# Patient Record
Sex: Female | Born: 1955 | Race: White | Hispanic: No | Marital: Married | Smoking: Former smoker
Health system: Southern US, Community
[De-identification: ages and names within clinical notes are randomized; demographics above are authoritative.]

## PROBLEM LIST (undated history)

## (undated) DIAGNOSIS — E7800 Pure hypercholesterolemia, unspecified: Secondary | ICD-10-CM

## (undated) DIAGNOSIS — I10 Essential (primary) hypertension: Secondary | ICD-10-CM

## (undated) DIAGNOSIS — G4730 Sleep apnea, unspecified: Secondary | ICD-10-CM

## (undated) DIAGNOSIS — F1010 Alcohol abuse, uncomplicated: Secondary | ICD-10-CM

## (undated) HISTORY — DX: Sleep apnea, unspecified: G47.30

## (undated) HISTORY — PX: KNEE ARTHROSCOPY: SUR90

## (undated) HISTORY — DX: Alcohol abuse, uncomplicated: F10.10

---

## 1997-07-14 ENCOUNTER — Other Ambulatory Visit: Admission: RE | Admit: 1997-07-14 | Discharge: 1997-07-14 | Payer: Self-pay | Admitting: *Deleted

## 1997-11-29 ENCOUNTER — Ambulatory Visit (HOSPITAL_COMMUNITY): Admission: RE | Admit: 1997-11-29 | Discharge: 1997-11-29 | Payer: Self-pay | Admitting: Internal Medicine

## 1997-12-07 ENCOUNTER — Encounter: Payer: Self-pay | Admitting: Internal Medicine

## 1997-12-07 ENCOUNTER — Ambulatory Visit (HOSPITAL_COMMUNITY): Admission: RE | Admit: 1997-12-07 | Discharge: 1997-12-07 | Payer: Self-pay | Admitting: Internal Medicine

## 1998-06-15 ENCOUNTER — Encounter: Payer: Self-pay | Admitting: *Deleted

## 1998-06-15 ENCOUNTER — Ambulatory Visit (HOSPITAL_COMMUNITY): Admission: RE | Admit: 1998-06-15 | Discharge: 1998-06-15 | Payer: Self-pay | Admitting: *Deleted

## 1999-11-07 ENCOUNTER — Encounter: Payer: Self-pay | Admitting: *Deleted

## 1999-11-07 ENCOUNTER — Ambulatory Visit (HOSPITAL_COMMUNITY): Admission: RE | Admit: 1999-11-07 | Discharge: 1999-11-07 | Payer: Self-pay | Admitting: *Deleted

## 1999-11-15 ENCOUNTER — Other Ambulatory Visit: Admission: RE | Admit: 1999-11-15 | Discharge: 1999-11-15 | Payer: Self-pay | Admitting: *Deleted

## 2000-11-25 ENCOUNTER — Encounter: Payer: Self-pay | Admitting: *Deleted

## 2000-11-25 ENCOUNTER — Ambulatory Visit (HOSPITAL_COMMUNITY): Admission: RE | Admit: 2000-11-25 | Discharge: 2000-11-25 | Payer: Self-pay | Admitting: *Deleted

## 2001-11-12 ENCOUNTER — Other Ambulatory Visit: Admission: RE | Admit: 2001-11-12 | Discharge: 2001-11-12 | Payer: Self-pay | Admitting: *Deleted

## 2002-12-31 ENCOUNTER — Ambulatory Visit (HOSPITAL_COMMUNITY): Admission: RE | Admit: 2002-12-31 | Discharge: 2002-12-31 | Payer: Self-pay | Admitting: *Deleted

## 2003-03-17 ENCOUNTER — Other Ambulatory Visit: Admission: RE | Admit: 2003-03-17 | Discharge: 2003-03-17 | Payer: Self-pay | Admitting: *Deleted

## 2003-08-02 ENCOUNTER — Ambulatory Visit (HOSPITAL_COMMUNITY): Admission: RE | Admit: 2003-08-02 | Discharge: 2003-08-02 | Payer: Self-pay | Admitting: Orthopedic Surgery

## 2003-08-02 ENCOUNTER — Ambulatory Visit (HOSPITAL_BASED_OUTPATIENT_CLINIC_OR_DEPARTMENT_OTHER): Admission: RE | Admit: 2003-08-02 | Discharge: 2003-08-02 | Payer: Self-pay | Admitting: Orthopedic Surgery

## 2004-02-23 ENCOUNTER — Ambulatory Visit: Payer: Self-pay | Admitting: Internal Medicine

## 2004-04-10 ENCOUNTER — Other Ambulatory Visit: Admission: RE | Admit: 2004-04-10 | Discharge: 2004-04-10 | Payer: Self-pay | Admitting: *Deleted

## 2004-05-02 ENCOUNTER — Ambulatory Visit: Payer: Self-pay | Admitting: Internal Medicine

## 2004-07-25 ENCOUNTER — Ambulatory Visit: Payer: Self-pay | Admitting: Internal Medicine

## 2004-08-08 ENCOUNTER — Ambulatory Visit: Payer: Self-pay | Admitting: Internal Medicine

## 2004-10-31 ENCOUNTER — Encounter: Admission: RE | Admit: 2004-10-31 | Discharge: 2004-10-31 | Payer: Self-pay | Admitting: Internal Medicine

## 2004-11-01 ENCOUNTER — Ambulatory Visit: Payer: Self-pay | Admitting: Internal Medicine

## 2005-08-04 ENCOUNTER — Ambulatory Visit: Payer: Self-pay | Admitting: Internal Medicine

## 2005-08-07 ENCOUNTER — Ambulatory Visit: Payer: Self-pay | Admitting: Internal Medicine

## 2005-08-10 ENCOUNTER — Ambulatory Visit (HOSPITAL_COMMUNITY): Admission: RE | Admit: 2005-08-10 | Discharge: 2005-08-10 | Payer: Self-pay | Admitting: Internal Medicine

## 2005-08-22 ENCOUNTER — Ambulatory Visit: Payer: Self-pay | Admitting: Internal Medicine

## 2005-09-11 ENCOUNTER — Ambulatory Visit: Payer: Self-pay | Admitting: Internal Medicine

## 2005-10-12 ENCOUNTER — Ambulatory Visit: Payer: Self-pay | Admitting: Internal Medicine

## 2006-01-23 ENCOUNTER — Encounter: Admission: RE | Admit: 2006-01-23 | Discharge: 2006-01-23 | Payer: Self-pay | Admitting: Obstetrics and Gynecology

## 2006-03-05 ENCOUNTER — Ambulatory Visit: Payer: Self-pay | Admitting: Internal Medicine

## 2006-03-05 LAB — CONVERTED CEMR LAB
ALT: 18 units/L (ref 0–40)
AST: 16 units/L (ref 0–37)
Albumin: 4 g/dL (ref 3.5–5.2)
Alkaline Phosphatase: 57 units/L (ref 39–117)
BUN: 22 mg/dL (ref 6–23)
Basophils Absolute: 0 10*3/uL (ref 0.0–0.1)
Basophils Relative: 0.6 % (ref 0.0–1.0)
CO2: 29 meq/L (ref 19–32)
Calcium: 9.3 mg/dL (ref 8.4–10.5)
Chloride: 104 meq/L (ref 96–112)
Cholesterol: 225 mg/dL (ref 0–200)
Creatinine, Ser: 0.8 mg/dL (ref 0.4–1.2)
Direct LDL: 141.1 mg/dL
Eosinophils Relative: 3.9 % (ref 0.0–5.0)
GFR calc Af Amer: 98 mL/min
GFR calc non Af Amer: 81 mL/min
Glucose, Bld: 94 mg/dL (ref 70–99)
HCT: 32.4 % — ABNORMAL LOW (ref 36.0–46.0)
HDL: 51.4 mg/dL (ref >39.0)
Hemoglobin: 11.3 g/dL — ABNORMAL LOW (ref 12.0–15.0)
Lymphocytes Relative: 43.2 % (ref 12.0–46.0)
MCHC: 34.7 g/dL (ref 30.0–36.0)
MCV: 87.8 fL (ref 78.0–100.0)
Monocytes Absolute: 0.4 10*3/uL (ref 0.2–0.7)
Monocytes Relative: 7.5 % (ref 3.0–11.0)
Neutro Abs: 2.6 10*3/uL (ref 1.4–7.7)
Neutrophils Relative %: 44.8 % (ref 43.0–77.0)
Platelets: 249 10*3/uL (ref 150–400)
Potassium: 3.6 meq/L (ref 3.5–5.1)
RBC: 3.7 M/uL — ABNORMAL LOW (ref 3.87–5.11)
RDW: 14.1 % (ref 11.5–14.6)
Sodium: 138 meq/L (ref 135–145)
TSH: 3.53 microintl units/mL (ref 0.35–5.50)
Total Bilirubin: 0.5 mg/dL (ref 0.3–1.2)
Total CHOL/HDL Ratio: 4.4
Total Protein: 6.9 g/dL (ref 6.0–8.3)
Triglycerides: 128 mg/dL (ref 0–149)
VLDL: 26 mg/dL (ref 0–40)
WBC: 5.7 10*3/uL (ref 4.5–10.5)

## 2006-03-15 ENCOUNTER — Ambulatory Visit: Payer: Self-pay | Admitting: Internal Medicine

## 2006-04-16 ENCOUNTER — Ambulatory Visit: Payer: Self-pay | Admitting: Internal Medicine

## 2006-05-22 ENCOUNTER — Ambulatory Visit: Payer: Self-pay | Admitting: Internal Medicine

## 2006-05-22 LAB — CONVERTED CEMR LAB
Basophils Absolute: 0 10*3/uL (ref 0.0–0.1)
Basophils Relative: 0.5 % (ref 0.0–1.0)
Hemoglobin: 11.5 g/dL — ABNORMAL LOW (ref 12.0–15.0)
Iron: 84 ug/dL (ref 42–145)
Lymphocytes Relative: 38.2 % (ref 12.0–46.0)
MCV: 88.1 fL (ref 78.0–100.0)
Monocytes Absolute: 0.2 10*3/uL (ref 0.2–0.7)
Neutrophils Relative %: 51 % (ref 43.0–77.0)
Platelets: 250 10*3/uL (ref 150–400)
RBC: 3.78 M/uL — ABNORMAL LOW (ref 3.87–5.11)
RDW: 14.6 % (ref 11.5–14.6)
Saturation Ratios: 25.1 % (ref 20.0–50.0)
WBC: 3.9 10*3/uL — ABNORMAL LOW (ref 4.5–10.5)

## 2007-05-08 ENCOUNTER — Encounter: Admission: RE | Admit: 2007-05-08 | Discharge: 2007-05-08 | Payer: Self-pay | Admitting: Family Medicine

## 2007-05-28 ENCOUNTER — Emergency Department (HOSPITAL_COMMUNITY): Admission: EM | Admit: 2007-05-28 | Discharge: 2007-05-28 | Payer: Self-pay | Admitting: Emergency Medicine

## 2007-05-28 ENCOUNTER — Ambulatory Visit: Payer: Self-pay | Admitting: Cardiovascular Disease

## 2007-06-12 ENCOUNTER — Ambulatory Visit: Payer: Self-pay

## 2007-06-23 ENCOUNTER — Ambulatory Visit: Payer: Self-pay | Admitting: Cardiovascular Disease

## 2008-06-23 ENCOUNTER — Encounter: Admission: RE | Admit: 2008-06-23 | Discharge: 2008-06-23 | Payer: Self-pay | Admitting: Obstetrics and Gynecology

## 2009-06-28 ENCOUNTER — Encounter: Admission: RE | Admit: 2009-06-28 | Discharge: 2009-06-28 | Payer: Self-pay | Admitting: Family Medicine

## 2010-02-20 ENCOUNTER — Ambulatory Visit (HOSPITAL_COMMUNITY)
Admission: RE | Admit: 2010-02-20 | Discharge: 2010-02-20 | Payer: Self-pay | Source: Home / Self Care | Attending: Family Medicine | Admitting: Family Medicine

## 2010-03-05 ENCOUNTER — Encounter: Payer: Self-pay | Admitting: *Deleted

## 2010-03-05 ENCOUNTER — Encounter: Payer: Self-pay | Admitting: Internal Medicine

## 2010-06-27 NOTE — Assessment & Plan Note (Signed)
[facility]                            CARDIOLOGY OFFICE NOTE   NAME:Noble, Leslie                        MRN:          [id number]  DATE:06/23/2007                            DOB:          1955/08/19    Leslie Noble presents for hospital follow-up at the [facility] Cardiology  office on Jun 23, 2007.  I saw Leslie Noble in the emergency room at  [facility] on May 28, 2007, when she presented with chest pain.  She  described 1-hour of substernal chest pain that felt like a squeezing and  pressure sensation.  Her pain was at rest.  She has had prior episodes  of the same, but the episode of that presentation was her longest to  date.  She had no other associated symptoms.  She exercises regularly  and is completely asymptomatic with activity.  She has no past cardiac  history.  Her cardiac risk factors include hypertension, dyslipidemia  and obstructive sleep apnea.   Since her last evaluation in April 2009, she has had one other episode  of chest pain.  She was in [place] when she developed similar  symptoms of chest pressure that she rated as a 5/5 out of 10.  There  were less severe than the episode that brought her to the emergency  room.  The duration was approximately 20-30 minutes, and the symptoms  spontaneously resolved.  She has had no other problems since her  emergency room visit.  She underwent an adenosine Myoview stress study  in the office on April 30th.  The adenosine protocol was used because  she had marked diastolic blood pressure elevation of 110 mmHg, which  precluded exercise based on our protocol in the office.  With adenosine,  there was normal homogenous uptake with no evidence of ischemia.   MEDICATIONS:  1. Benicar/HCT 40/25 mg daily.  2. Nisoldipine ER 20 mg daily.  3. Fish oil one daily.   ALLERGIES:  NKDA.   PHYSICAL EXAMINATION:  GENERAL:  The patient is alert and oriented.  She  is in no acute distress.  VITAL  SIGNS:  Weights 180.  Blood pressure 124/84, heart rate 72,  respiratory rate 12.  HEENT:  Normal.  NECK:  Normal carotid upstrokes without bruits.  Jugulovenous pressure  normal.  LUNGS:  Clear bilaterally.  HEART:  Regular rate and rhythm without murmurs or gallops.  ABDOMEN:  Soft, nontender.  No organomegaly.  EXTREMITIES:  No clubbing, cyanosis or edema.  Peripheral pulses 2+ and  equal throughout.  SKIN:  Warm and dry without rash.   ASSESSMENT:  Leslie Noble is a 55 year old woman with noncardiac chest  pain.  Her evaluation to date has included a D-dimer in the emergency  room which was less than 0.22 and an EKG and stress test, both of which  have been normal.  I think she should continue with primary risk  reduction measures for long-term reduction in cardiac events.  Her blood  pressure at the office today appears well-controlled on a combination of  Benicar, HCT and nisoldipine.  She is followed  by Dr. Kathrene Bongo for  her hypertension.  With respect to her lipids, I do not have a copy, but  she tells me that her LDL was 158.  She is currently undergoing a trial  of lifestyle modification.  I discussed this with her today and advised  a low threshold, to consider pharmacotherapy if at her next cholesterol  check her LDL remains greater than 130.  I will defer to Dr. Lynelle Doctor, who  I believe has her scheduled for a cholesterol recheck in a few months.   Finally, with regard to her chest pain, I talked about an empiric proton  pump inhibitor, but she did not want to start on another daily medicine.  She will try Tums on a p.r.n. basis in the case that her chest pain is  related to gastroesophageal reflux or esophageal spasm.  If refractory  symptoms occur, I will defer further evaluation to Dr. Lynelle Doctor.  I would  be happy to see Leslie Noble back on an as-needed basis in the future.     Veverly Fells. Excell Seltzer, MD  Electronically Signed    MDC/MedQ  DD: 06/23/2007  DT: 06/23/2007   Job #: [id number]   cc:   Lavonda Jumbo, M.D.  Cecille Aver, M.D.

## 2010-06-27 NOTE — Consult Note (Signed)
NAME:  DAMIAN, HOFSTRA NO.:  [id number]   MEDICAL RECORD NO.:  [id number]          PATIENT TYPE:  EMS   LOCATION:  MAJO                         FACILITY:  [facility]   PHYSICIAN:  Veverly Fells. Excell Seltzer, MD  DATE OF BIRTH:  1955/02/17   DATE OF CONSULTATION:  05/28/2007  DATE OF DISCHARGE:  05/28/2007                                 CONSULTATION   REASON FOR CONSULTATION:  Chest pain.   HISTORY OF PRESENT ILLNESS:  Ms. Choo is a 55 year old woman who we  were asked to see by the emergency room physician for evaluation of  chest pain.   She presented with 1 hour of substernal chest pain that occurred at  rest.  It was a squeezing sensation that has now fully resolved.  The  pain was nonradiating.  She has had similar symptoms in the past, but  this was the longest episode that she has had today.  Ms. Doorn is  active, and she is able to exercise without any symptoms.  She recently  rode her bike for 6 or 7 miles with no symptoms whatsoever.  She denies  exertional dyspnea, palpitations, lightheadedness, diaphoresis, nausea,  vomiting, or edema.   She has no cardiac history to date.  She does have a history of  esophageal reflux, but her symptoms are distinct from her typical reflux  symptoms.   PAST MEDICAL HISTORY:  Pertinent for hypertension, dyslipidemia, and  obstructive sleep apnea.   PAST SURGICAL HISTORY:  Pertinent for left knee chondroplasty performed  in 2005.   MEDICATIONS:  Company secretary.   ALLERGIES:  NKDA.   FAMILY HISTORY:  There is no history of coronary artery disease in  either parents.  Her mother has a pacemaker.  No CAD or MI in the  family.   SOCIAL HISTORY:  The patient is married.  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  She has a remote history of smoking and does not  drink alcohol.   REVIEW OF SYSTEMS:  Complete 12-point review of systems was performed  and is negative except as detailed above.   PHYSICAL  EXAMINATION:  GENERAL:  The patient is alert and oriented.  She  is in no acute distress.  VITAL SIGNS:  Heart rate is 64, blood pressure 128/68, and respiratory  rate is 12.  HEENT:  Normal.  NECK:  Normal carotid upstrokes without bruits.  Jugular venous pressure  is normal.  LUNGS:  Clear to auscultation bilaterally.  HEART:  The apex is discrete and nondisplaced.  Heart is regular rate  and rhythm without murmurs or gallops.  ABDOMEN:  Soft and nontender.  No organomegaly.  EXTREMITIES:  No clubbing, cyanosis, or edema.  SKIN:  Warm and dry without rash.  NEUROLOGIC:  Grossly intact.  Cranial nerves II through XII are intact.  Strength 5/5 and equal in the arms and legs.   LABORATORY DATA:  EKG shows sinus rhythm with left atrial enlargement,  otherwise within normal limits.   ASSESSMENT:  This is a 55 year old woman with chest pain.  Her symptoms  have some typical  and atypical features.  I think overall her risk is  quite low in the setting of a normal ECG and normal lab work.  Of note,  her D-dimer is less than 0.22, and her cardiac markers are normal with a  troponin of less than 0.05 and a CK-MB of less than 1.0.  I think she  could be discharged home with outpatient followup which would include  exercise, Myoview stress study, and an office visit.  This will be  arranged and the patient will be contacted tomorrow with these  appointments.  If she has recurrent symptoms, she was advised to seek  immediate evaluation.      Veverly Fells. Excell Seltzer, MD  Electronically Signed     MDC/MEDQ  D:  05/28/2007  T:  05/29/2007  Job:  [id number]

## 2010-06-30 NOTE — Op Note (Signed)
NAME:  Leslie Noble, Leslie Noble                         ACCOUNT NO.:  [id number]   MEDICAL RECORD NO.:  [id number]                   PATIENT TYPE:  AMB   LOCATION:  DSC                                  FACILITY:  [facility]   PHYSICIAN:  Robert A. Thurston Hole, M.D.              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   PROCEDURE:  1. Left knee exam under anesthesia followed by arthroscopic partial lateral     meniscectomy.  2. Left knee chondroplasty.   SURGEON:  Elana Alm. Thurston Hole, M.D.   ASSISTANT:  Julien Girt, P.A.   ANESTHESIA:  Local with MAC.   OPERATIVE TIME:  30 minutes.   COMPLICATIONS:  None.   INDICATIONS FOR PROCEDURE:  [name] is a 55 year old woman who has had 4-  5 months of increasing left knee pain with exam and MRI documenting an  extensive complex lateral meniscus tear with chondromalacia who has failed  conservative care and is now to undergo arthroscopy.   DESCRIPTION OF PROCEDURE:  [name] was brought to the operating room on  August 02, 2003, after a knee block had been placed in the holding room.  She  was placed on the operating table in supine position.  Her left knee was  examined under anesthesia.  She had full range of motion, 2+ crepitation, 2+  synovitis, the knee was stable to ligamentous exam with normal patellar  tracking.  The left leg was prepped using sterile DuraPrep and draped using  sterile technique.  Originally through an anterolateral portal, the  arthroscope with a pump attachment was placed and through an anteromedial  portal, an arthroscopic probe was placed.  On initial inspection of the  medial compartment, the articular cartilage was normal.  The medial meniscus  was normal.  The  intercondylar notch was inspected and the anterior and  posterior cruciate ligaments were normal.  The lateral compartment was  inspected, she had 30-40% grade 3 and 20% grade 4 changes on the lateral  femoral condyle and lateral tibial plateau and this was debrided.  She had a  complex tear of the posterior and lateral horn of the lateral meniscus and  this was thoroughly debrided.  75% of the posterior and lateral horn was  resected back to a stable but degenerative rim.  The patellofemoral joint  articular cartilage was normal.  The patella tracked normally.  Medial and  lateral gutters showed mild synovitis, otherwise, this was free of  pathology.  After this was done, it was felt that all pathology had been  satisfactorily addressed.  The instruments were removed.  The portals were  closed with a 3-0 nylon suture and injected with  0.25% Marcaine with  epinephrine and 4 mg morphine.  Sterile dressings were applied.  The patient  was awakened and taken to the recovery room in stable condition.   FOLLOW UP:  [name] will be followed as an outpatient on Vicodin and  Naprosyn.  See her back in the office in one week for sutures out and follow  up.                                               Robert A. Thurston Hole, M.D.    RAW/MEDQ  D:  08/02/2003  T:  08/02/2003  Job:  [id number]

## 2010-07-17 ENCOUNTER — Telehealth: Payer: Self-pay | Admitting: Internal Medicine

## 2010-07-17 NOTE — Telephone Encounter (Signed)
Pt is not a pt of Dr.Hoppers.

## 2010-07-26 ENCOUNTER — Other Ambulatory Visit: Payer: Self-pay | Admitting: Obstetrics and Gynecology

## 2010-07-26 DIAGNOSIS — Z1231 Encounter for screening mammogram for malignant neoplasm of breast: Secondary | ICD-10-CM

## 2010-08-01 ENCOUNTER — Ambulatory Visit
Admission: RE | Admit: 2010-08-01 | Discharge: 2010-08-01 | Disposition: A | Discharge status: Home / Self Care | Payer: BC Managed Care – PPO | Source: Ambulatory Visit | Attending: Obstetrics and Gynecology | Admitting: Obstetrics and Gynecology

## 2010-08-01 DIAGNOSIS — Z1231 Encounter for screening mammogram for malignant neoplasm of breast: Secondary | ICD-10-CM

## 2010-11-07 LAB — CBC
HCT: 37.3
Hemoglobin: 12.6
MCV: 88.2
WBC: 5.5

## 2010-11-07 LAB — DIFFERENTIAL
Basophils Relative: 0
Eosinophils Absolute: 0.2
Eosinophils Relative: 3
Lymphocytes Relative: 26
Monocytes Absolute: 0.6
Monocytes Relative: 10
Neutrophils Relative %: 60

## 2010-11-07 LAB — POCT I-STAT, CHEM 8
Chloride: 101
HCT: 40

## 2010-11-07 LAB — POCT CARDIAC MARKERS
Myoglobin, poc: 37
Operator id: 284251
Troponin i, poc: <0.05

## 2011-10-29 ENCOUNTER — Other Ambulatory Visit: Payer: Self-pay | Admitting: Obstetrics and Gynecology

## 2011-10-29 DIAGNOSIS — Z1231 Encounter for screening mammogram for malignant neoplasm of breast: Secondary | ICD-10-CM

## 2011-11-13 ENCOUNTER — Ambulatory Visit
Admission: RE | Admit: 2011-11-13 | Discharge: 2011-11-13 | Disposition: A | Discharge status: Home / Self Care | Payer: BC Managed Care – PPO | Source: Ambulatory Visit | Attending: Obstetrics and Gynecology | Admitting: Obstetrics and Gynecology

## 2011-11-13 DIAGNOSIS — Z1231 Encounter for screening mammogram for malignant neoplasm of breast: Secondary | ICD-10-CM

## 2012-10-30 ENCOUNTER — Ambulatory Visit: Payer: BC Managed Care – PPO | Admitting: Neurology

## 2012-11-03 ENCOUNTER — Other Ambulatory Visit: Payer: Self-pay

## 2012-11-03 DIAGNOSIS — Z1231 Encounter for screening mammogram for malignant neoplasm of breast: Secondary | ICD-10-CM

## 2012-11-19 ENCOUNTER — Ambulatory Visit
Admission: RE | Admit: 2012-11-19 | Discharge: 2012-11-19 | Disposition: A | Discharge status: Home / Self Care | Payer: BC Managed Care – PPO | Source: Ambulatory Visit

## 2012-11-19 DIAGNOSIS — Z1231 Encounter for screening mammogram for malignant neoplasm of breast: Secondary | ICD-10-CM

## 2012-12-10 ENCOUNTER — Emergency Department (HOSPITAL_COMMUNITY)
Admission: EM | Admit: 2012-12-10 | Discharge: 2012-12-10 | Disposition: A | Discharge status: Home / Self Care | Payer: BC Managed Care – PPO | Attending: Emergency Medicine | Admitting: Emergency Medicine

## 2012-12-10 ENCOUNTER — Emergency Department (HOSPITAL_COMMUNITY): Payer: BC Managed Care – PPO

## 2012-12-10 ENCOUNTER — Encounter (HOSPITAL_COMMUNITY): Payer: Self-pay | Admitting: Emergency Medicine

## 2012-12-10 DIAGNOSIS — I10 Essential (primary) hypertension: Secondary | ICD-10-CM | POA: Insufficient documentation

## 2012-12-10 DIAGNOSIS — J069 Acute upper respiratory infection, unspecified: Secondary | ICD-10-CM | POA: Insufficient documentation

## 2012-12-10 DIAGNOSIS — IMO0001 Reserved for inherently not codable concepts without codable children: Secondary | ICD-10-CM | POA: Insufficient documentation

## 2012-12-10 DIAGNOSIS — R0989 Other specified symptoms and signs involving the circulatory and respiratory systems: Secondary | ICD-10-CM | POA: Insufficient documentation

## 2012-12-10 DIAGNOSIS — R5381 Other malaise: Secondary | ICD-10-CM | POA: Insufficient documentation

## 2012-12-10 DIAGNOSIS — E7800 Pure hypercholesterolemia, unspecified: Secondary | ICD-10-CM | POA: Insufficient documentation

## 2012-12-10 DIAGNOSIS — Z79899 Other long term (current) drug therapy: Secondary | ICD-10-CM | POA: Insufficient documentation

## 2012-12-10 DIAGNOSIS — J029 Acute pharyngitis, unspecified: Secondary | ICD-10-CM | POA: Insufficient documentation

## 2012-12-10 DIAGNOSIS — B9789 Other viral agents as the cause of diseases classified elsewhere: Secondary | ICD-10-CM

## 2012-12-10 DIAGNOSIS — J3489 Other specified disorders of nose and nasal sinuses: Secondary | ICD-10-CM | POA: Insufficient documentation

## 2012-12-10 HISTORY — DX: Pure hypercholesterolemia, unspecified: E78.00

## 2012-12-10 HISTORY — DX: Essential (primary) hypertension: I10

## 2012-12-10 MED ORDER — ALBUTEROL SULFATE HFA 108 (90 BASE) MCG/ACT IN AERS: 1.0000 | INHALATION_SPRAY | Freq: Four times a day (QID) | RESPIRATORY_TRACT | Status: DC | PRN

## 2012-12-10 MED ORDER — GUAIFENESIN 100 MG/5ML PO LIQD: 100.0000 mg | ORAL | Status: DC | PRN

## 2012-12-10 MED ORDER — IBUPROFEN 800 MG PO TABS: 800.0000 mg | ORAL_TABLET | Freq: Three times a day (TID) | ORAL | Status: DC | PRN

## 2012-12-10 NOTE — Progress Notes (Signed)
Patient confirms her pcp is Dr. Cain Saupe of [facility].  System updated.

## 2012-12-10 NOTE — ED Notes (Signed)
Pt states the past 3 days she's had a cough, cold, today started having a fever.

## 2012-12-10 NOTE — ED Provider Notes (Signed)
Medical screening examination/treatment/procedure(s) were performed by non-physician practitioner and as supervising physician I was immediately available for consultation/collaboration.  EKG Interpretation   None         Jeromey Kruer H Patricia Perales, MD 12/10/12 2309 

## 2012-12-10 NOTE — ED Provider Notes (Signed)
CSN: [id number]     Arrival date & time 12/10/12  1523 History  This chart was scribed for non-physician practitioner Trixie Dredge, PA-C working with Richardean Canal, MD by Leone Payor, ED Scribe. This patient was seen in room WTR8/WTR8 and the patient's care was started at 1523.    Chief Complaint  Patient presents with  . Fever  . Cough    The history is provided by the patient. No language interpreter was used.    HPI Comments: Leslie Noble is a 57 y.o. female who presents to the Emergency Department complaining of 3 days of gradual onset, gradually worsening, constant cough and fatigue with associated sneezing, rhinorrhea, chest congestion, itchy eyes, mild sore throat, mild sinus pressure, and mylagias. Pt states she began to have a fever today, TMAX 100.0 at home. Pt recently returned from a trip to [place] and [place]. She tried to make an appointment with her PCP but was unable to see them. She has OTC airborne vitamin C without relief. Pt reports having a flu vaccine this season. She denies abdominal pain, nausea, emesis, dysuria., SOB.  Husband recently sick with similar symptoms but has since improved.   Past Medical History  Diagnosis Date  . Hypertension   . High cholesterol    History reviewed. No pertinent past surgical history. No family history on file. History  Substance Use Topics  . Smoking status: Never Smoker   . Smokeless tobacco: Never Used  . Alcohol Use: Yes   OB History   Grav Para Term Preterm Abortions TAB SAB Ect Mult Living                 Review of Systems  Constitutional: Positive for fever.  HENT: Positive for congestion, rhinorrhea, sinus pressure, sneezing and sore throat. Negative for trouble swallowing.   Respiratory: Positive for cough. Negative for shortness of breath.   Gastrointestinal: Negative for nausea and abdominal pain.  Genitourinary: Negative for dysuria, urgency and frequency.    Allergies  Review of patient's allergies indicates  no known allergies.  Home Medications   Current Outpatient Rx  Name  Route  Sig  Dispense  Refill  . calcium carbonate (OS-CAL - DOSED IN MG OF ELEMENTAL CALCIUM) 1250 MG tablet   Oral   Take 1 tablet by mouth daily with breakfast.         . cholecalciferol (VITAMIN D) 1000 UNITS tablet   Oral   Take 1,000 Units by mouth daily.         . nisoldipine (SULAR) 20 MG 24 hr tablet   Oral   Take 20 mg by mouth daily.         Marland Kitchen olmesartan (BENICAR) 20 MG tablet   Oral   Take 20 mg by mouth daily.         Marland Kitchen omega-3 acid ethyl esters (LOVAZA) 1 G capsule   Oral   Take 2 g by mouth at bedtime.         Marland Kitchen omeprazole (PRILOSEC) 20 MG capsule   Oral   Take 20 mg by mouth at bedtime.         . pravastatin (PRAVACHOL) 20 MG tablet   Oral   Take 20 mg by mouth at bedtime.          BP 126/61  Pulse 90  Temp(Src) 99.9 F (37.7 C) (Oral)  Resp 16  SpO2 97% Physical Exam  Nursing note and vitals reviewed. Constitutional: She appears well-developed and well-nourished. No  distress.  HENT:  Head: Normocephalic and atraumatic.  Mouth/Throat: Oropharynx is clear and moist. No oropharyngeal exudate.  Neck: Neck supple.  Cardiovascular: Normal rate and regular rhythm.   Pulmonary/Chest: Effort normal and breath sounds normal. No respiratory distress. She has no wheezes. She has no rales.  Abdominal: Soft. She exhibits no distension. There is no tenderness. There is no rebound and no guarding.  Neurological: She is alert.  Skin: She is not diaphoretic.    ED Course  Procedures  DIAGNOSTIC STUDIES: Oxygen Saturation is 97% on RA, normal by my interpretation.    COORDINATION OF CARE: 5:00 PM Discussed treatment plan with pt at bedside and pt agreed to plan.    Labs Review Labs Reviewed - No data to display Imaging Review Dg Chest 2 View  12/10/2012   CLINICAL DATA:  Fever, cough  EXAM: CHEST  2 VIEW  COMPARISON:  05/28/2007  FINDINGS: The heart size and mediastinal  contours are within normal limits. Both lungs are clear. The visualized skeletal structures are unremarkable.  IMPRESSION: No active cardiopulmonary disease.   Electronically Signed   By: Ruel Favors M.D.   On: 12/10/2012 17:16    EKG Interpretation   None       MDM   1. Viral respiratory illness    Pt with respiratory symptoms x 3 days, recent travel by boat and plane - husband recently sick with similar symptoms.  CXR negative.  Afebrile (99.9) here.  Likely viral illness.  Pt d/c home with symptomatic medications.  I do not believe she would benefit from an antibiotic at this time - I had a long discussion with her and with her husband regarding this.  Discussed result, findings, treatment, and follow up  with patient.  Pt given return precautions.  Pt verbalizes understanding and agrees with plan.      I personally performed the services described in this documentation, which was scribed in my presence. The recorded information has been reviewed and is accurate.   Trixie Dredge, PA-C 12/10/12 1750

## 2013-03-03 ENCOUNTER — Ambulatory Visit (HOSPITAL_COMMUNITY)
Admission: RE | Admit: 2013-03-03 | Discharge: 2013-03-03 | Disposition: A | Discharge status: Home / Self Care | Payer: BC Managed Care – PPO | Source: Ambulatory Visit | Attending: Obstetrics and Gynecology | Admitting: Obstetrics and Gynecology

## 2013-03-03 ENCOUNTER — Other Ambulatory Visit (HOSPITAL_COMMUNITY): Payer: Self-pay | Admitting: Obstetrics and Gynecology

## 2013-03-03 DIAGNOSIS — N959 Unspecified menopausal and perimenopausal disorder: Secondary | ICD-10-CM

## 2013-03-05 ENCOUNTER — Ambulatory Visit (HOSPITAL_COMMUNITY)
Admission: RE | Admit: 2013-03-05 | Discharge: 2013-03-05 | Disposition: A | Discharge status: Home / Self Care | Payer: BC Managed Care – PPO | Source: Ambulatory Visit | Attending: Obstetrics and Gynecology | Admitting: Obstetrics and Gynecology

## 2013-03-05 DIAGNOSIS — Z780 Asymptomatic menopausal state: Secondary | ICD-10-CM | POA: Insufficient documentation

## 2013-03-05 DIAGNOSIS — Z13820 Encounter for screening for osteoporosis: Secondary | ICD-10-CM | POA: Insufficient documentation

## 2014-03-17 ENCOUNTER — Other Ambulatory Visit: Payer: Self-pay

## 2014-03-17 DIAGNOSIS — Z1231 Encounter for screening mammogram for malignant neoplasm of breast: Secondary | ICD-10-CM

## 2014-03-25 ENCOUNTER — Ambulatory Visit
Admission: RE | Admit: 2014-03-25 | Discharge: 2014-03-25 | Disposition: A | Discharge status: Home / Self Care | Payer: BLUE CROSS/BLUE SHIELD | Source: Ambulatory Visit

## 2014-03-25 ENCOUNTER — Encounter (INDEPENDENT_AMBULATORY_CARE_PROVIDER_SITE_OTHER): Payer: Self-pay

## 2014-03-25 DIAGNOSIS — Z1231 Encounter for screening mammogram for malignant neoplasm of breast: Secondary | ICD-10-CM

## 2014-04-29 ENCOUNTER — Encounter: Payer: Self-pay | Admitting: Internal Medicine

## 2014-06-29 ENCOUNTER — Ambulatory Visit: Payer: BLUE CROSS/BLUE SHIELD | Admitting: Internal Medicine

## 2014-09-24 ENCOUNTER — Encounter: Payer: Self-pay | Admitting: Gastroenterology

## 2014-12-10 ENCOUNTER — Ambulatory Visit (INDEPENDENT_AMBULATORY_CARE_PROVIDER_SITE_OTHER): Payer: BLUE CROSS/BLUE SHIELD | Admitting: Gastroenterology

## 2014-12-10 ENCOUNTER — Encounter: Payer: Self-pay | Admitting: Gastroenterology

## 2014-12-10 VITALS — BP 112/68 | HR 72 | Ht 67.0 in | Wt 183.4 lb

## 2014-12-10 DIAGNOSIS — R195 Other fecal abnormalities: Secondary | ICD-10-CM

## 2014-12-10 DIAGNOSIS — K219 Gastro-esophageal reflux disease without esophagitis: Secondary | ICD-10-CM | POA: Diagnosis not present

## 2014-12-10 MED ORDER — FAMOTIDINE 20 MG PO TABS: 40.0000 mg | ORAL_TABLET | Freq: Every day | ORAL | Status: DC

## 2014-12-10 NOTE — Progress Notes (Signed)
Leslie Noble    [id number]    1955-04-15  Primary Care Physician:FULP, CAMMIE, MD  Referring Physician: Antony Blackbird, MD [address], [place] [address]  Chief complaint:  Heme positive stool  HPI:  60 year old female here for evaluation of heme positive stool. She denies any bright red blood per rectum or melena. She had colonoscopy in 2006, was normal. On review of system, she complains of heartburn for past 10-15 years. She was taking omeprazole before but self discontinued after she read reports regarding chronic kidney disease associated with PPIs. She wakes up sometimes choking middle of the night, also has sore throat and hoarseness of voice. Currently she is not taking any antacids. Denies any difficulty swallowing, no nausea or vomiting.   Outpatient Encounter Prescriptions as of 12/10/2014  Medication Sig  . calcium carbonate (OS-CAL - DOSED IN MG OF ELEMENTAL CALCIUM) 1250 MG tablet Take 1 tablet by mouth daily with breakfast.  . cholecalciferol (VITAMIN D) 1000 UNITS tablet Take 1,000 Units by mouth daily.  . Multiple Vitamins-Minerals (CENTRUM SILVER ULTRA WOMENS PO) Take 1 tablet by mouth daily.  . Naltrexone (VIVITROL) 380 MG SUSR Inject 380 mg into the muscle every 30 (thirty) days.  . nisoldipine (SULAR) 20 MG 24 hr tablet Take 20 mg by mouth daily.  Marland Kitchen olmesartan (BENICAR) 20 MG tablet Take 20 mg by mouth daily.  . quinapril-hydrochlorothiazide (ACCURETIC) 20-12.5 MG tablet Take 2 tablets by mouth daily.  . famotidine (PEPCID) 20 MG tablet Take 2 tablets (40 mg total) by mouth daily.  . [DISCONTINUED] albuterol (PROVENTIL HFA;VENTOLIN HFA) 108 (90 BASE) MCG/ACT inhaler Inhale 1-2 puffs into the lungs every 6 (six) hours as needed for wheezing.  . [DISCONTINUED] guaiFENesin (ROBITUSSIN) 100 MG/5ML liquid Take 5-10 mLs (100-200 mg total) by mouth every 4 (four) hours as needed for cough.  . [DISCONTINUED] ibuprofen (ADVIL,MOTRIN) 800 MG tablet Take  1 tablet (800 mg total) by mouth every 8 (eight) hours as needed for pain.  . [DISCONTINUED] omega-3 acid ethyl esters (LOVAZA) 1 G capsule Take 2 g by mouth at bedtime.  . [DISCONTINUED] omeprazole (PRILOSEC) 20 MG capsule Take 20 mg by mouth at bedtime.  . [DISCONTINUED] pravastatin (PRAVACHOL) 20 MG tablet Take 20 mg by mouth at bedtime.   No facility-administered encounter medications on file as of 12/10/2014.    Allergies as of 12/10/2014  . (No Known Allergies)    Past Medical History  Diagnosis Date  . Hypertension   . High cholesterol   . Sleep apnea     CPAP  . Alcohol abuse     Past Surgical History  Procedure Laterality Date  . Knee arthroscopy Left     Family History  Problem Relation Age of Onset  . Hypertension Father   . Barrett's esophagus Father   . GER disease Father   . Sleep apnea Father   . Breast cancer Mother   . Heart disease Mother   . Drug abuse Brother     drug overdose-deceased  . Cancer Paternal Grandmother     cancer of the body wall  . Alcoholism Maternal Grandfather     Social History   Social History  . Marital Status: Married    Spouse Name: N/A  . Number of Children: 3  . Years of Education: N/A   Occupational History  . xray tech    Social History Main Topics  . Smoking status: Former Smoker  Types: Cigarettes    Quit date: 02/12/1982  . Smokeless tobacco: Never Used  . Alcohol Use: 0.0 oz/week    0 Standard drinks or equivalent per week     Comment: wine- 2-3 per day  . Drug Use: No  . Sexual Activity: Not on file   Other Topics Concern  . Not on file   Social History Narrative      Review of systems: Review of Systems  Constitutional: Negative for fever and chills.  HENT: Negative.   Eyes: Negative for blurred vision.  Respiratory: Negative for cough, shortness of breath and wheezing.   Cardiovascular: Negative for chest pain and palpitations.  Gastrointestinal: as per HPI Genitourinary: Negative  for dysuria, urgency, frequency and hematuria.  Musculoskeletal: Negative for myalgias, back pain and joint pain.  Skin: Negative for itching and rash.  Neurological: Negative for dizziness, tremors, focal weakness, seizures and loss of consciousness.  Endo/Heme/Allergies: Negative for environmental allergies.  Psychiatric/Behavioral: Negative for depression, suicidal ideas and hallucinations.  All other systems reviewed and are negative.   Physical Exam: Filed Vitals:   12/10/14 1445  BP: 112/68  Pulse: 72   Gen:      No acute distress HEENT:  EOMI, sclera anicteric Neck:     No masses; no thyromegaly Lungs:    Clear to auscultation bilaterally; normal respiratory effort CV:         Regular rate and rhythm; no murmurs Abd:      + bowel sounds; soft, non-tender; no palpable masses, no distension Ext:    No edema; adequate peripheral perfusion Skin:      Warm and dry; no rash Neuro: alert and oriented x 3 Psych: normal mood and affect  Data Reviewed:  Colonoscopy 2006: normal   Assessment and Plan/Recommendations: 59 year old Caucasian white female here for evaluation of heme-positive stool, and also has chronic GERD. We will schedule for EGD and colonoscopy Start Pepcid 40 mg at bedtime, patient is reluctant to start PPI's Discussed in detail and the reflux measures: No meals 3 hours before bedtime and sleep with head and elevation She is scheduled for abdominoplasty in January, discussed about doing colonoscopy and endoscopy prior to the procedure if unable to schedule advised her to reschedule the abdominoplasty. He do not have recent labs in our system, but according to patient she has done them some labs recently. We'll obtained from her PMDs office. Return after the procedures  K. Denzil Magnuson , MD [phone] Mon-Fri 8a-5p [phone] after 5p, weekends, holidays

## 2014-12-10 NOTE — Patient Instructions (Signed)
We have scheduled you for your Colonoscopy/Endoscopy at [facility] Endoscopy Department Separate instructions have been given We will send Pepcid 40mg  to your pharmacy Follow up after procedures

## 2014-12-13 ENCOUNTER — Encounter: Payer: Self-pay | Admitting: Internal Medicine

## 2014-12-23 ENCOUNTER — Telehealth: Payer: Self-pay | Admitting: Gastroenterology

## 2014-12-23 MED ORDER — NA SULFATE-K SULFATE-MG SULF 17.5-3.13-1.6 GM/177ML PO SOLN: 1.0000 | Freq: Once | ORAL | Status: DC

## 2014-12-23 NOTE — Telephone Encounter (Signed)
Suprep sent to pharmacy 

## 2014-12-28 ENCOUNTER — Encounter (HOSPITAL_COMMUNITY): Payer: Self-pay | Admitting: *Deleted

## 2014-12-31 ENCOUNTER — Ambulatory Visit (HOSPITAL_COMMUNITY)
Admission: RE | Admit: 2014-12-31 | Discharge: 2014-12-31 | Disposition: A | Discharge status: Home / Self Care | Payer: BLUE CROSS/BLUE SHIELD | Source: Ambulatory Visit | Attending: Gastroenterology | Admitting: Gastroenterology

## 2014-12-31 ENCOUNTER — Encounter (HOSPITAL_COMMUNITY)
Admission: RE | Disposition: A | Discharge status: Home / Self Care | Payer: Self-pay | Source: Ambulatory Visit | Attending: Gastroenterology

## 2014-12-31 ENCOUNTER — Ambulatory Visit (HOSPITAL_COMMUNITY): Payer: BLUE CROSS/BLUE SHIELD | Admitting: Anesthesiology

## 2014-12-31 ENCOUNTER — Encounter (HOSPITAL_COMMUNITY): Payer: Self-pay | Admitting: *Deleted

## 2014-12-31 DIAGNOSIS — I10 Essential (primary) hypertension: Secondary | ICD-10-CM | POA: Insufficient documentation

## 2014-12-31 DIAGNOSIS — D125 Benign neoplasm of sigmoid colon: Secondary | ICD-10-CM | POA: Diagnosis not present

## 2014-12-31 DIAGNOSIS — K317 Polyp of stomach and duodenum: Secondary | ICD-10-CM | POA: Diagnosis not present

## 2014-12-31 DIAGNOSIS — K635 Polyp of colon: Secondary | ICD-10-CM | POA: Diagnosis not present

## 2014-12-31 DIAGNOSIS — D128 Benign neoplasm of rectum: Secondary | ICD-10-CM

## 2014-12-31 DIAGNOSIS — G4730 Sleep apnea, unspecified: Secondary | ICD-10-CM | POA: Insufficient documentation

## 2014-12-31 DIAGNOSIS — K449 Diaphragmatic hernia without obstruction or gangrene: Secondary | ICD-10-CM | POA: Diagnosis not present

## 2014-12-31 DIAGNOSIS — Z79899 Other long term (current) drug therapy: Secondary | ICD-10-CM | POA: Insufficient documentation

## 2014-12-31 DIAGNOSIS — K648 Other hemorrhoids: Secondary | ICD-10-CM | POA: Insufficient documentation

## 2014-12-31 DIAGNOSIS — E7800 Pure hypercholesterolemia, unspecified: Secondary | ICD-10-CM | POA: Insufficient documentation

## 2014-12-31 DIAGNOSIS — K2950 Unspecified chronic gastritis without bleeding: Secondary | ICD-10-CM | POA: Insufficient documentation

## 2014-12-31 DIAGNOSIS — K219 Gastro-esophageal reflux disease without esophagitis: Secondary | ICD-10-CM | POA: Diagnosis not present

## 2014-12-31 DIAGNOSIS — R195 Other fecal abnormalities: Secondary | ICD-10-CM | POA: Diagnosis not present

## 2014-12-31 DIAGNOSIS — K319 Disease of stomach and duodenum, unspecified: Secondary | ICD-10-CM | POA: Diagnosis not present

## 2014-12-31 DIAGNOSIS — Z87891 Personal history of nicotine dependence: Secondary | ICD-10-CM | POA: Diagnosis not present

## 2014-12-31 DIAGNOSIS — K2210 Ulcer of esophagus without bleeding: Secondary | ICD-10-CM | POA: Insufficient documentation

## 2014-12-31 HISTORY — PX: ESOPHAGOGASTRODUODENOSCOPY (EGD) WITH PROPOFOL: SHX5813

## 2014-12-31 HISTORY — PX: COLONOSCOPY WITH PROPOFOL: SHX5780

## 2014-12-31 SURGERY — COLONOSCOPY WITH PROPOFOL
Anesthesia: Monitor Anesthesia Care

## 2014-12-31 MED ORDER — PROPOFOL 10 MG/ML IV BOLUS
INTRAVENOUS | Status: AC
  Filled 2014-12-31: qty 20

## 2014-12-31 MED ORDER — SODIUM CHLORIDE 0.9 % IV SOLN: INTRAVENOUS | Status: DC

## 2014-12-31 MED ORDER — DEXLANSOPRAZOLE 30 MG PO CPDR: 30.0000 mg | DELAYED_RELEASE_CAPSULE | Freq: Every day | ORAL | Status: DC

## 2014-12-31 MED ORDER — LACTATED RINGERS IV SOLN
INTRAVENOUS | Status: DC
  Administered 2014-12-31 (×2): via INTRAVENOUS

## 2014-12-31 MED ORDER — PROPOFOL 10 MG/ML IV BOLUS
INTRAVENOUS | Status: AC
  Filled 2014-12-31: qty 40

## 2014-12-31 MED ORDER — PROPOFOL 10 MG/ML IV BOLUS
INTRAVENOUS | Status: DC | PRN
  Administered 2014-12-31 (×4): 20 mg via INTRAVENOUS
  Administered 2014-12-31: 10 mg via INTRAVENOUS
  Administered 2014-12-31: 40 mg via INTRAVENOUS
  Administered 2014-12-31: 10 mg via INTRAVENOUS
  Administered 2014-12-31 (×7): 20 mg via INTRAVENOUS
  Administered 2014-12-31 (×3): 40 mg via INTRAVENOUS
  Administered 2014-12-31 (×3): 20 mg via INTRAVENOUS
  Administered 2014-12-31: 40 mg via INTRAVENOUS
  Administered 2014-12-31 (×3): 20 mg via INTRAVENOUS
  Administered 2014-12-31: 40 mg via INTRAVENOUS
  Administered 2014-12-31 (×5): 20 mg via INTRAVENOUS
  Administered 2014-12-31: 40 mg via INTRAVENOUS
  Administered 2014-12-31 (×4): 20 mg via INTRAVENOUS

## 2014-12-31 SURGICAL SUPPLY — 24 items

## 2014-12-31 NOTE — H&P (View-Only) (Signed)
         Leslie Noble    [id number]    01/31/1956  Primary Care Physician:FULP, CAMMIE, MD  Referring Physician: Cammie Fulp, MD [address] [place], [place] [address]  Chief complaint:  Heme positive stool  HPI:  59-year-old female here for evaluation of heme positive stool. She denies any bright red blood per rectum or melena. She had colonoscopy in 2006, was normal. On review of system, she complains of heartburn for past 10-15 years. She was taking omeprazole before but self discontinued after she read reports regarding chronic kidney disease associated with PPIs. She wakes up sometimes choking middle of the night, also has sore throat and hoarseness of voice. Currently she is not taking any antacids. Denies any difficulty swallowing, no nausea or vomiting.   Outpatient Encounter Prescriptions as of 12/10/2014  Medication Sig  . calcium carbonate (OS-CAL - DOSED IN MG OF ELEMENTAL CALCIUM) 1250 MG tablet Take 1 tablet by mouth daily with breakfast.  . cholecalciferol (VITAMIN D) 1000 UNITS tablet Take 1,000 Units by mouth daily.  . Multiple Vitamins-Minerals (CENTRUM SILVER ULTRA WOMENS PO) Take 1 tablet by mouth daily.  . Naltrexone (VIVITROL) 380 MG SUSR Inject 380 mg into the muscle every 30 (thirty) days.  . nisoldipine (SULAR) 20 MG 24 hr tablet Take 20 mg by mouth daily.  . olmesartan (BENICAR) 20 MG tablet Take 20 mg by mouth daily.  . quinapril-hydrochlorothiazide (ACCURETIC) 20-12.5 MG tablet Take 2 tablets by mouth daily.  . famotidine (PEPCID) 20 MG tablet Take 2 tablets (40 mg total) by mouth daily.  . [DISCONTINUED] albuterol (PROVENTIL HFA;VENTOLIN HFA) 108 (90 BASE) MCG/ACT inhaler Inhale 1-2 puffs into the lungs every 6 (six) hours as needed for wheezing.  . [DISCONTINUED] guaiFENesin (ROBITUSSIN) 100 MG/5ML liquid Take 5-10 mLs (100-200 mg total) by mouth every 4 (four) hours as needed for cough.  . [DISCONTINUED] ibuprofen (ADVIL,MOTRIN) 800 MG tablet Take  1 tablet (800 mg total) by mouth every 8 (eight) hours as needed for pain.  . [DISCONTINUED] omega-3 acid ethyl esters (LOVAZA) 1 G capsule Take 2 g by mouth at bedtime.  . [DISCONTINUED] omeprazole (PRILOSEC) 20 MG capsule Take 20 mg by mouth at bedtime.  . [DISCONTINUED] pravastatin (PRAVACHOL) 20 MG tablet Take 20 mg by mouth at bedtime.   No facility-administered encounter medications on file as of 12/10/2014.    Allergies as of 12/10/2014  . (No Known Allergies)    Past Medical History  Diagnosis Date  . Hypertension   . High cholesterol   . Sleep apnea     CPAP  . Alcohol abuse     Past Surgical History  Procedure Laterality Date  . Knee arthroscopy Left     Family History  Problem Relation Age of Onset  . Hypertension Father   . Barrett's esophagus Father   . GER disease Father   . Sleep apnea Father   . Breast cancer Mother   . Heart disease Mother   . Drug abuse Brother     drug overdose-deceased  . Cancer Paternal Grandmother     cancer of the body wall  . Alcoholism Maternal Grandfather     Social History   Social History  . Marital Status: Married    Spouse Name: N/A  . Number of Children: 3  . Years of Education: N/A   Occupational History  . xray tech    Social History Main Topics  . Smoking status: Former Smoker      Types: Cigarettes    Quit date: 02/12/1982  . Smokeless tobacco: Never Used  . Alcohol Use: 0.0 oz/week    0 Standard drinks or equivalent per week     Comment: wine- 2-3 per day  . Drug Use: No  . Sexual Activity: Not on file   Other Topics Concern  . Not on file   Social History Narrative      Review of systems: Review of Systems  Constitutional: Negative for fever and chills.  HENT: Negative.   Eyes: Negative for blurred vision.  Respiratory: Negative for cough, shortness of breath and wheezing.   Cardiovascular: Negative for chest pain and palpitations.  Gastrointestinal: as per HPI Genitourinary: Negative  for dysuria, urgency, frequency and hematuria.  Musculoskeletal: Negative for myalgias, back pain and joint pain.  Skin: Negative for itching and rash.  Neurological: Negative for dizziness, tremors, focal weakness, seizures and loss of consciousness.  Endo/Heme/Allergies: Negative for environmental allergies.  Psychiatric/Behavioral: Negative for depression, suicidal ideas and hallucinations.  All other systems reviewed and are negative.   Physical Exam: Filed Vitals:   12/10/14 1445  BP: 112/68  Pulse: 72   Gen:      No acute distress HEENT:  EOMI, sclera anicteric Neck:     No masses; no thyromegaly Lungs:    Clear to auscultation bilaterally; normal respiratory effort CV:         Regular rate and rhythm; no murmurs Abd:      + bowel sounds; soft, non-tender; no palpable masses, no distension Ext:    No edema; adequate peripheral perfusion Skin:      Warm and dry; no rash Neuro: alert and oriented x 3 Psych: normal mood and affect  Data Reviewed:  Colonoscopy 2006: normal   Assessment and Plan/Recommendations: 59-year-old Caucasian white female here for evaluation of heme-positive stool, and also has chronic GERD. We will schedule for EGD and colonoscopy Start Pepcid 40 mg at bedtime, patient is reluctant to start PPI's Discussed in detail and the reflux measures: No meals 3 hours before bedtime and sleep with head and elevation She is scheduled for abdominoplasty in January, discussed about doing colonoscopy and endoscopy prior to the procedure if unable to schedule advised her to reschedule the abdominoplasty. He do not have recent labs in our system, but according to patient she has done them some labs recently. We'll obtained from her PMDs office. Return after the procedures  K. Veena Nandigam , MD [phone] Mon-Fri 8a-5p [phone] after 5p, weekends, holidays   

## 2014-12-31 NOTE — Anesthesia Postprocedure Evaluation (Signed)
  Anesthesia Post-op Note  Patient: Leslie Noble  Procedure(s) Performed: Procedure(s) (LRB): COLONOSCOPY WITH PROPOFOL (N/A) ESOPHAGOGASTRODUODENOSCOPY (EGD) WITH PROPOFOL (N/A)  Patient Location: PACU  Anesthesia Type: MAC  Level of Consciousness: awake and alert   Airway and Oxygen Therapy: Patient Spontanous Breathing  Post-op Pain: mild  Post-op Assessment: Post-op Vital signs reviewed, Patient's Cardiovascular Status Stable, Respiratory Function Stable, Patent Airway and No signs of Nausea or vomiting  Last Vitals:  Filed Vitals:   12/31/14 1000  BP: 139/69  Pulse: 63  Temp: 37.2 C  Resp: 14    Post-op Vital Signs: stable   Complications: No apparent anesthesia complications

## 2014-12-31 NOTE — Op Note (Signed)
[facility] [place], [address]   ENDOSCOPY PROCEDURE REPORT  PATIENT: Leslie, Noble  MR#: [id number] BIRTHDATE: December 05, 1955 , 26  yrs. old GENDER: female ENDOSCOPIST: Harl Bowie, MD REFERRED BY:  Cammie Fulp PROCEDURE DATE:  12/31/2014 PROCEDURE:  EGD w/ biopsy ASA CLASS:     Class II INDICATIONS:  hemocult positive stool. MEDICATIONS: Monitored anesthesia care TOPICAL ANESTHETIC: none  DESCRIPTION OF PROCEDURE: After the risks benefits and alternatives of the procedure were thoroughly explained, informed consent was obtained.  The Pentax Gastroscope [id number] endoscope was introduced through the mouth and advanced to the second portion of the duodenum , Without limitations.  The instrument was slowly withdrawn as the mucosa was fully examined.    La grade B erosive esophagitis.  regular Z line small hiatal hernia. Gastric antral erythema with superficial erosions status post multiple random biopsies from antrum and body of stomach.  Multiple gastric polyps along the lesser curvature likely fundic gland polyps were biopsied. Duodenum appeared normal.  Retroflexed views revealed as previously described.     The scope was then withdrawn from the patient and the procedure completed.  COMPLICATIONS: There were no immediate complications.  ENDOSCOPIC IMPRESSION: La grade B erosive esophagitis. small hiatal hernia. Gastric  polyps along the lesser curvature likely fundic gland polyps . Gastropathy  RECOMMENDATIONS: PPI daily Await pathology report Follow up in office visit    eSigned:  Harl Bowie, MD 12/31/2014 10:14 AM

## 2014-12-31 NOTE — Discharge Instructions (Signed)
Colonoscopy, Care After °Refer to this sheet in the next few weeks. These instructions provide you with information on caring for yourself after your procedure. Your health care provider may also give you more specific instructions. Your treatment has been planned according to current medical practices, but problems sometimes occur. Call your health care provider if you have any problems or questions after your procedure. °WHAT TO EXPECT AFTER THE PROCEDURE  °After your procedure, it is typical to have the following: °· A small amount of blood in your stool. °· Moderate amounts of gas and mild abdominal cramping or bloating. °HOME CARE INSTRUCTIONS °· Do not drive, operate machinery, or sign important documents for 24 hours. °· You may shower and resume your regular physical activities, but move at a slower pace for the first 24 hours. °· Take frequent rest periods for the first 24 hours. °· Walk around or put a warm pack on your abdomen to help reduce abdominal cramping and bloating. °· Drink enough fluids to keep your urine clear or pale yellow. °· You may resume your normal diet as instructed by your health care provider. Avoid heavy or fried foods that are hard to digest. °· Avoid drinking alcohol for 24 hours or as instructed by your health care provider. °· Only take over-the-counter or prescription medicines as directed by your health care provider. °· If a tissue sample (biopsy) was taken during your procedure: °¨ Do not take aspirin or blood thinners for 7 days, or as instructed by your health care provider. °¨ Do not drink alcohol for 7 days, or as instructed by your health care provider. °¨ Eat soft foods for the first 24 hours. °SEEK MEDICAL CARE IF: °You have persistent spotting of blood in your stool 2-3 days after the procedure. °SEEK IMMEDIATE MEDICAL CARE IF: °· You have more than a small spotting of blood in your stool. °· You pass large blood clots in your stool. °· Your abdomen is swollen  (distended). °· You have nausea or vomiting. °· You have a fever. °· You have increasing abdominal pain that is not relieved with medicine. °  °This information is not intended to replace advice given to you by your health care provider. Make sure you discuss any questions you have with your health care provider. °  °Document Released: 09/13/2003 Document Revised: 11/19/2012 Document Reviewed: 10/06/2012 °Elsevier Interactive Patient Education ©2016 Elsevier Inc. ° °

## 2014-12-31 NOTE — Op Note (Signed)
[facility] [place], [address]   COLONOSCOPY PROCEDURE REPORT  PATIENT: Noble, Leslie  MR#: [id number] BIRTHDATE: [birth year] , 22  yrs. old GENDER: female ENDOSCOPIST: Harl Bowie, MD REFERRED [id number] Fulp PROCEDURE DATE:  12/31/2014 PROCEDURE:   Colonoscopy, diagnostic First Screening Colonoscopy - Avg.  risk and is 50 yrs.  old or older - No.      History of Adenoma - Now for follow-up colonoscopy & has been > or = to 3 yrs.  N/A  Polyps removed today? Yes ASA CLASS:   Class II INDICATIONS:Evaluation of unexplained GI bleeding and Colorectal Neoplasm Risk Assessment for this procedure is average risk. MEDICATIONS: Monitored anesthesia care  DESCRIPTION OF PROCEDURE:   After the risks benefits and alternatives of the procedure were thoroughly explained, informed consent was obtained.  The digital rectal exam revealed no abnormalities of the rectum.   The [id number]  endoscope was introduced through the anus and advanced to the cecum, which was identified by both the appendix and ileocecal valve. No adverse events experienced.   The quality of the prep was good.  The instrument was then slowly withdrawn as the colon was fully examined. Estimated blood loss is zero unless otherwise noted in this procedure report.   COLON FINDINGS: Few scattered diverticula in the ascending colon. 48mm sessile polyp in sigmoid colon removed by cold snare and few dimunitive hyperplastic appearing polyps in rectum removed by biopsy forceps Small internal hemorrhoids.  Retroflexed views revealed internal hemorrhoids. The time to cecum = 19.2 Withdrawal time = 13.9   The scope was withdrawn and the procedure completed. COMPLICATIONS: There were no immediate complications.  ENDOSCOPIC IMPRESSION: Small benign appearing polyps Small internal hemorrhoids  RECOMMENDATIONS: 1.  If the polyp(s) removed today are proven to be  adenomatous (pre-cancerous) polyps, you will need a repeat colonoscopy in 5 years.  Otherwise you should continue to follow colorectal cancer screening guidelines for "routine risk" patients with colonoscopy in 10 years.  You will receive a letter within 1-2 weeks with the results of your biopsy as well as final recommendations.  Please call my office if you have not received a letter after 3 weeks. 2.  Follow up colonoscopy in 5 years  eSigned:  Harl Bowie, MD 12/31/2014 10:18 AM

## 2014-12-31 NOTE — Transfer of Care (Signed)
Immediate Anesthesia Transfer of Care Note  Patient: Leslie Noble  Procedure(s) Performed: Procedure(s): COLONOSCOPY WITH PROPOFOL (N/A) ESOPHAGOGASTRODUODENOSCOPY (EGD) WITH PROPOFOL (N/A)  Patient Location: PACU  Anesthesia Type:MAC  Level of Consciousness: sedated  Airway & Oxygen Therapy: Patient Spontanous Breathing and Patient connected to nasal cannula oxygen  Post-op Assessment: Report given to RN and Post -op Vital signs reviewed and stable  Post vital signs: Reviewed and stable  Last Vitals:  Filed Vitals:   12/31/14 0816  BP: 184/74  Pulse: 69  Temp: 37 C  Resp: 16    Complications: No apparent anesthesia complications

## 2014-12-31 NOTE — Anesthesia Preprocedure Evaluation (Signed)
Anesthesia Evaluation  Patient identified by MRN, date of birth, ID band Patient awake    Reviewed: Allergy & Precautions, NPO status , Patient's Chart, lab work & pertinent test results  Airway Mallampati: II  TM Distance: >3 FB Neck ROM: Full    Dental no notable dental hx.    Pulmonary sleep apnea and Continuous Positive Airway Pressure Ventilation , former smoker,    Pulmonary exam normal breath sounds clear to auscultation       Cardiovascular hypertension, Pt. on medications Normal cardiovascular exam Rhythm:Regular Rate:Normal     Neuro/Psych negative neurological ROS  negative psych ROS   GI/Hepatic negative GI ROS, (+)       alcohol use, On Naltrexone for EtOH   Endo/Other  negative endocrine ROS  Renal/GU negative Renal ROS  negative genitourinary   Musculoskeletal negative musculoskeletal ROS (+)   Abdominal   Peds negative pediatric ROS (+)  Hematology negative hematology ROS (+)   Anesthesia Other Findings   Reproductive/Obstetrics negative OB ROS                             Anesthesia Physical Anesthesia Plan  ASA: II  Anesthesia Plan: MAC   Post-op Pain Management:    Induction:   Airway Management Planned:   Additional Equipment:   Intra-op Plan:   Post-operative Plan:   Informed Consent: I have reviewed the patients History and Physical, chart, labs and discussed the procedure including the risks, benefits and alternatives for the proposed anesthesia with the patient or authorized representative who has indicated his/her understanding and acceptance.   Dental advisory given  Plan Discussed with: CRNA  Anesthesia Plan Comments: (Propofol only. Pt on naltrexone for EtOH. No opioids.)        Anesthesia Quick Evaluation

## 2014-12-31 NOTE — Interval H&P Note (Signed)
History and Physical Interval Note:  12/31/2014 9:03 AM  Leslie Noble  has presented today for surgery, with the diagnosis of Hem pos stool GERD  The various methods of treatment have been discussed with the patient and family. After consideration of risks, benefits and other options for treatment, the patient has consented to  Procedure(s): COLONOSCOPY WITH PROPOFOL (N/A) ESOPHAGOGASTRODUODENOSCOPY (EGD) WITH PROPOFOL (N/A) as a surgical intervention .  The patient's history has been reviewed, patient examined, no change in status, stable for surgery.  I have reviewed the patient's chart and labs.  Questions were answered to the patient's satisfaction.     [name]

## 2015-01-03 ENCOUNTER — Encounter (HOSPITAL_COMMUNITY): Payer: Self-pay | Admitting: Gastroenterology

## 2015-01-20 ENCOUNTER — Encounter: Payer: Self-pay | Admitting: Gastroenterology

## 2015-03-08 ENCOUNTER — Ambulatory Visit (INDEPENDENT_AMBULATORY_CARE_PROVIDER_SITE_OTHER): Payer: BLUE CROSS/BLUE SHIELD | Admitting: Gastroenterology

## 2015-03-08 ENCOUNTER — Encounter: Payer: Self-pay | Admitting: Gastroenterology

## 2015-03-08 VITALS — BP 132/72 | HR 70 | Ht 67.0 in | Wt 175.4 lb

## 2015-03-08 DIAGNOSIS — K219 Gastro-esophageal reflux disease without esophagitis: Secondary | ICD-10-CM

## 2015-03-08 NOTE — Progress Notes (Signed)
Leslie Noble    [id number]    1955/09/05  Primary Care Physician:FULP, CAMMIE, MD  Referring Physician: Antony Blackbird, MD [address], [place] [address]  Chief complaint:  GERD  HPI: 60 year old female here for follow up of GERD.  She feels her symptoms are well controlled on Dexilant once a day.  EGD and colonoscopy done last month  showed La grade B erosive esophagitis, small hiatal hernia, Gastric polyps along the lesser curvature likely fundic gland polyps and mild antral Gastropathy.  H.pylori negative on biopsy.  diminutive polyp was removed on colonoscopy which was hyperplastic.  He underwent abdominoplasty 2 weeks ago and is recovering from it.  Otherwise has no  GI complaints during this office visit and denies any nausea, vomiting, abdominal pain, melena or bright red blood per rectum    Outpatient Encounter Prescriptions as of 03/08/2015  Medication Sig  . calcium carbonate (OS-CAL - DOSED IN MG OF ELEMENTAL CALCIUM) 1250 MG tablet Take 1 tablet by mouth daily with breakfast.  . cholecalciferol (VITAMIN D) 1000 UNITS tablet Take 1,000 Units by mouth daily.  Marland Kitchen Dexlansoprazole (DEXILANT) 30 MG capsule Take 1 capsule (30 mg total) by mouth daily.  . Multiple Vitamins-Minerals (CENTRUM SILVER ULTRA WOMENS PO) Take 1 tablet by mouth daily.  . naltrexone (DEPADE) 50 MG tablet Take 50 mg by mouth daily.  . nisoldipine (SULAR) 20 MG 24 hr tablet Take 20 mg by mouth daily.  . quinapril-hydrochlorothiazide (ACCURETIC) 20-12.5 MG tablet Take 2 tablets by mouth daily.  Marland Kitchen zolpidem (AMBIEN) 10 MG tablet Take 5 mg by mouth at bedtime as needed.  . [DISCONTINUED] famotidine (PEPCID) 20 MG tablet Take 2 tablets (40 mg total) by mouth daily. (Patient taking differently: Take 40 mg by mouth every evening. )  . [DISCONTINUED] Naltrexone (VIVITROL) 380 MG SUSR Inject 380 mg into the muscle every 30 (thirty) days. Receives at [facility]   No  facility-administered encounter medications on file as of 03/08/2015.    Allergies as of 03/08/2015  . (No Known Allergies)    Past Medical History  Diagnosis Date  . Hypertension   . High cholesterol   . Sleep apnea     CPAP  . Alcohol abuse     Past Surgical History  Procedure Laterality Date  . Knee arthroscopy Left   . Colonoscopy with propofol N/A 12/31/2014    Procedure: COLONOSCOPY WITH PROPOFOL;  Surgeon: Mauri Pole, MD;  Location: [facility] ENDOSCOPY;  Service: Endoscopy;  Laterality: N/A;  . Esophagogastroduodenoscopy (egd) with propofol N/A 12/31/2014    Procedure: ESOPHAGOGASTRODUODENOSCOPY (EGD) WITH PROPOFOL;  Surgeon: Mauri Pole, MD;  Location: [facility] ENDOSCOPY;  Service: Endoscopy;  Laterality: N/A;    Family History  Problem Relation Age of Onset  . Hypertension Father   . Barrett's esophagus Father   . GER disease Father   . Sleep apnea Father   . Breast cancer Mother   . Heart disease Mother   . Drug abuse Brother     drug overdose-deceased  . Cancer Paternal Grandmother     cancer of the body wall  . Alcoholism Maternal Grandfather     Social History   Social History  . Marital Status: Married    Spouse Name: N/A  . Number of Children: 3  . Years of Education: N/A   Occupational History  . xray tech    Social History Main Topics  . Smoking status: Former Smoker  Types: Cigarettes    Quit date: 02/12/1982  . Smokeless tobacco: Never Used  . Alcohol Use: 0.0 oz/week    0 Standard drinks or equivalent per week     Comment: wine- 2-3 per day  . Drug Use: No  . Sexual Activity: Not on file   Other Topics Concern  . Not on file   Social History Narrative      Review of systems: Review of Systems  Constitutional: Negative for fever and chills.  HENT: Negative.   Eyes: Negative for blurred vision.  Respiratory: Negative for cough, shortness of breath and wheezing.   Cardiovascular: Negative for chest pain and palpitations.    Gastrointestinal: as per HPI Genitourinary: Negative for dysuria, urgency, frequency and hematuria.  Musculoskeletal: Negative for myalgias, back pain and joint pain.  Skin: Negative for itching and rash.  Neurological: Negative for dizziness, tremors, focal weakness, seizures and loss of consciousness.  Endo/Heme/Allergies: Negative for environmental allergies.  Psychiatric/Behavioral: Negative for depression, suicidal ideas and hallucinations.  All other systems reviewed and are negative.   Physical Exam: Filed Vitals:   03/08/15 1405  BP: 132/72  Pulse: 70   Gen:      No acute distress HEENT:  EOMI, sclera anicteric Neck:     No masses; no thyromegaly Lungs:    Clear to auscultation bilaterally; normal respiratory effort CV:         Regular rate and rhythm; no murmurs Ext:    No edema; adequate peripheral perfusion Skin:      Warm and dry; no rash Neuro: alert and oriented x 3 Psych: normal mood and affect  Data Reviewed:  As per HPI   Assessment and Plan/Recommendations:  60 year old female here for follow-up visit with chronic GERD status post recent EGD colonoscopy  She underwent abdominoplasty 2 weeks ago and is doing well in the postop period  Continue Dexilant once a day and follow antireflux measures  We may consider to taper the dose down or wean off PPI in next few months given she had significant weight loss status post abdominoplasty  Due for recall colonoscopy in 10 years  Return in 6 months  K. Denzil Magnuson , MD [phone] Mon-Fri 8a-5p [phone] after 5p, weekends, holidays

## 2015-03-08 NOTE — Patient Instructions (Signed)
Follow up in 6 months 

## 2015-03-09 ENCOUNTER — Telehealth: Payer: Self-pay | Admitting: *Deleted

## 2015-03-09 NOTE — Telephone Encounter (Signed)
Prior authorization approved for Dexilant through [facility] and cover my meds

## 2015-05-13 ENCOUNTER — Other Ambulatory Visit: Payer: Self-pay | Admitting: Gastroenterology

## 2015-06-09 ENCOUNTER — Other Ambulatory Visit: Payer: Self-pay

## 2015-06-09 DIAGNOSIS — Z1231 Encounter for screening mammogram for malignant neoplasm of breast: Secondary | ICD-10-CM

## 2015-07-01 ENCOUNTER — Telehealth: Payer: Self-pay | Admitting: Gastroenterology

## 2015-07-01 NOTE — Telephone Encounter (Signed)
Leslie Noble, please look into it. Thanks

## 2015-07-01 NOTE — Telephone Encounter (Signed)
Looks like she has been calling and disputing this bill since 03-29-15. Tells me she has written letters and calling billing office and can't get this resolved to her satisfaction.

## 2015-07-04 NOTE — Telephone Encounter (Signed)
Spoke to Billing office to have this taken care of. I also called the patient to make her aware.

## 2015-07-06 ENCOUNTER — Ambulatory Visit: Payer: BLUE CROSS/BLUE SHIELD

## 2015-08-12 ENCOUNTER — Ambulatory Visit: Payer: BLUE CROSS/BLUE SHIELD

## 2015-09-09 ENCOUNTER — Ambulatory Visit: Payer: BLUE CROSS/BLUE SHIELD

## 2015-09-09 ENCOUNTER — Ambulatory Visit
Admission: RE | Admit: 2015-09-09 | Discharge: 2015-09-09 | Disposition: A | Discharge status: Home / Self Care | Payer: BLUE CROSS/BLUE SHIELD | Source: Ambulatory Visit

## 2015-09-09 DIAGNOSIS — Z1231 Encounter for screening mammogram for malignant neoplasm of breast: Secondary | ICD-10-CM

## 2015-11-04 ENCOUNTER — Telehealth: Payer: Self-pay | Admitting: Gastroenterology

## 2015-11-07 NOTE — Telephone Encounter (Signed)
EGD done in Novemeber 2016. Erosive esophagitis. Father has Barrett's esophagus.  Patient has been off [place] for 3 days. She has not had any recurrent reflux symptoms.  How should she proceed from here? Does she need to be on an H2 blocker. She is not interested in long term PPI use.

## 2015-11-07 NOTE — Telephone Encounter (Signed)
The patient is instructed

## 2015-11-07 NOTE — Telephone Encounter (Signed)
Reviewed chart. If she is currently asymptomatic ok to hold PPI, can take H2 blocker as needed for occasional heartburn

## 2015-11-07 NOTE — Telephone Encounter (Signed)
Left message to call.

## 2015-11-13 ENCOUNTER — Other Ambulatory Visit: Payer: Self-pay | Admitting: Gastroenterology

## 2015-12-23 ENCOUNTER — Telehealth: Payer: Self-pay | Admitting: Emergency Medicine

## 2015-12-23 NOTE — Telephone Encounter (Signed)
Pt called and stated she was recommended to you by Dr Dellis Filbert. She is wondering if you would be willing to see her as a new patient. Please advise thanks.

## 2015-12-26 NOTE — Telephone Encounter (Signed)
Fine

## 2016-01-20 ENCOUNTER — Encounter: Payer: Self-pay | Admitting: Internal Medicine

## 2016-01-20 ENCOUNTER — Ambulatory Visit (INDEPENDENT_AMBULATORY_CARE_PROVIDER_SITE_OTHER): Payer: BLUE CROSS/BLUE SHIELD | Admitting: Internal Medicine

## 2016-01-20 VITALS — BP 154/98 | HR 72 | Temp 98.5°F | Resp 16 | Ht 67.0 in | Wt 176.0 lb

## 2016-01-20 DIAGNOSIS — K219 Gastro-esophageal reflux disease without esophagitis: Secondary | ICD-10-CM | POA: Diagnosis not present

## 2016-01-20 DIAGNOSIS — I10 Essential (primary) hypertension: Secondary | ICD-10-CM | POA: Insufficient documentation

## 2016-01-20 DIAGNOSIS — Z7289 Other problems related to lifestyle: Secondary | ICD-10-CM

## 2016-01-20 DIAGNOSIS — Z789 Other specified health status: Secondary | ICD-10-CM | POA: Diagnosis not present

## 2016-01-20 NOTE — Assessment & Plan Note (Signed)
Taking quinapril/hctz 40/25 daily and will resume her nisoldipine daily as her BP is above goal. Checking CMP today and adjust as needed.

## 2016-01-20 NOTE — Progress Notes (Signed)
   Subjective:    Patient ID: Leslie Noble, female    DOB: 03/28/1955, 60 y.o.   MRN: [id number]  HPI The patient is a new 60 YO female coming in for blood pressure and GERD. She does take blood pressure medicine for some time. Denies chest pains or SOB or abdominal pain. She was taken off her second blood pressure medicine at the last visit with her other doctor and since that time it is running high. She denies headaches or SOB or chest pains. She denies nausea or abdominal pain. Her GERD is doing worse recently and she is now taking OTC nexium daily. She was previously on dexilant for 6-7 months and then stopped successfully without getting symptoms back again for some time. 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 No vomiting, nausea, dark stools. Recent endoscopy without barrett's esophagus.   PMH, [facility], social history reviewed and updated.   Review of Systems  Constitutional: Negative for activity change, appetite change, fatigue, fever and unexpected weight change.  HENT: Negative.   Eyes: Negative.   Respiratory: Negative for cough, chest tightness, shortness of breath and wheezing.   Cardiovascular: Negative for chest pain, palpitations and leg swelling.  Gastrointestinal: Negative for abdominal distention, abdominal pain, constipation, diarrhea, nausea and vomiting.       GERD  Musculoskeletal: Negative.   Skin: Negative.   Neurological: Negative.   Psychiatric/Behavioral: Negative.       Objective:   Physical Exam  Constitutional: She is oriented to person, place, and time. She appears well-developed and well-nourished.  HENT:  Head: Normocephalic and atraumatic.  Eyes: EOM are normal.  Neck: Normal range of motion.  Cardiovascular: Normal rate and regular rhythm.   Pulmonary/Chest: Effort normal and breath sounds normal. No respiratory distress. She has no wheezes. She has no rales.  Abdominal: Soft. Bowel sounds are normal. She exhibits  no distension. There is no tenderness. There is no rebound.  Musculoskeletal: She exhibits no edema.  Neurological: She is alert and oriented to person, place, and time. Coordination normal.  Skin: Skin is warm and dry.  Psychiatric: She has a normal mood and affect.   Vitals:   01/20/16 1456  BP: (!) 154/98  Pulse: 72  Resp: 16  Temp: 98.5 F (36.9 C)  TempSrc: Oral  SpO2: 96%  Weight: 176 lb (79.8 kg)  Height: 5\' 7"  (1.702 m)      Assessment & Plan:

## 2016-01-20 NOTE — Assessment & Plan Note (Signed)
Taking nexium OTC and okay to continue. She does not wish to take pepcid BID for symptoms also.

## 2016-01-20 NOTE — Patient Instructions (Addendum)
We are checking the blood work so come back fasting to do that. Our lab is in the basement of the building you came to for the visit.   Start taking both blood pressure medicines again to help with the blood pressure.   Think about using the cpap again to help with the blood pressure if you are trying to get off some medications.

## 2016-01-20 NOTE — Assessment & Plan Note (Signed)
She does admit to having more problems with alcohol in the past and now she still struggles with it from time to time. She tries to keep to 2-3 drinks daily. Drinks wine only, no hard alcohol.

## 2016-02-17 ENCOUNTER — Ambulatory Visit (INDEPENDENT_AMBULATORY_CARE_PROVIDER_SITE_OTHER): Payer: BLUE CROSS/BLUE SHIELD | Admitting: Internal Medicine

## 2016-02-17 VITALS — BP 142/80 | HR 71 | Temp 98.1°F | Resp 14 | Ht 67.0 in | Wt 175.2 lb

## 2016-02-17 DIAGNOSIS — R221 Localized swelling, mass and lump, neck: Secondary | ICD-10-CM

## 2016-02-17 DIAGNOSIS — R0789 Other chest pain: Secondary | ICD-10-CM | POA: Diagnosis not present

## 2016-02-17 DIAGNOSIS — D170 Benign lipomatous neoplasm of skin and subcutaneous tissue of head, face and neck: Secondary | ICD-10-CM | POA: Insufficient documentation

## 2016-02-17 MED ORDER — DICLOFENAC SODIUM 1 % TD GEL: 2.0000 g | Freq: Four times a day (QID) | TRANSDERMAL | 3 refills | Status: DC

## 2016-02-17 NOTE — Progress Notes (Signed)
   Subjective:    Patient ID: Leslie Noble, female    DOB: 1955/10/25, 61 y.o.   MRN: [id number]  HPI The patient is a 61 YO female coming in for lump in her neck. Started aching some at night time about 7 months ago. At first was mild only and has worsened some in the last month. She has noticed a lump in the area of pain around the holidays. Thought it was muscular at first and was trying to stretch it out with no relief. Also has had another episode of atypical chest pain lasting 1 minute in the shower. Stress test back in 2009 chemical which was normal.   Review of Systems  Constitutional: Negative.   Respiratory: Negative.   Cardiovascular: Positive for chest pain.  Gastrointestinal: Negative.   Musculoskeletal: Positive for myalgias and neck pain. Negative for arthralgias, back pain and gait problem.  Neurological: Negative.       Objective:   Physical Exam  Constitutional: She is oriented to person, place, and time. She appears well-developed and well-nourished.  HENT:  Head: Normocephalic and atraumatic.  Eyes: EOM are normal.  Neck: Normal range of motion. No JVD present. No thyromegaly present.  Small soft tissue area in the posterior left neck which is non-tender to palpation and mobile.   Cardiovascular: Normal rate and regular rhythm.   Pulmonary/Chest: Effort normal and breath sounds normal.  Abdominal: Soft.  Neurological: She is alert and oriented to person, place, and time. Coordination normal.  Skin: Skin is warm and dry.   Vitals:   02/17/16 1316  BP: (!) 142/80  Pulse: 71  Resp: 14  Temp: 98.1 F (36.7 C)  TempSrc: Oral  SpO2: 98%  Weight: 175 lb 4 oz (79.5 kg)  Height: 5\' 7"  (1.702 m)      Assessment & Plan:

## 2016-02-17 NOTE — Progress Notes (Signed)
Pre visit review using our clinic review tool, if applicable. No additional management support is needed unless otherwise documented below in the visit note. 

## 2016-02-17 NOTE — Assessment & Plan Note (Signed)
Voltaren gel for the discomfort. Ordered US soft tissue neck to check for likely lipoma or lymph node where palpable swelling.

## 2016-02-17 NOTE — Patient Instructions (Signed)
We will check the ultrasound of the neck.  We have sent in voltaren gel for the pain you can use in the evening.   We will get you back in with Dr. Burt Knack as well.

## 2016-02-17 NOTE — Assessment & Plan Note (Signed)
Prior stress test in 2009, some atypical chest pain episodes which was been increasing in frequency lately making her concerned. Refer to cardiology for consideration of updating her stress test.

## 2016-02-21 ENCOUNTER — Ambulatory Visit
Admission: RE | Admit: 2016-02-21 | Discharge: 2016-02-21 | Disposition: A | Discharge status: Home / Self Care | Payer: BLUE CROSS/BLUE SHIELD | Source: Ambulatory Visit | Attending: Internal Medicine | Admitting: Internal Medicine

## 2016-02-21 DIAGNOSIS — R221 Localized swelling, mass and lump, neck: Secondary | ICD-10-CM

## 2016-03-04 ENCOUNTER — Encounter: Payer: Self-pay | Admitting: Internal Medicine

## 2016-03-07 ENCOUNTER — Encounter: Payer: Self-pay | Admitting: Internal Medicine

## 2016-03-07 ENCOUNTER — Other Ambulatory Visit (INDEPENDENT_AMBULATORY_CARE_PROVIDER_SITE_OTHER): Payer: BLUE CROSS/BLUE SHIELD

## 2016-03-07 DIAGNOSIS — I10 Essential (primary) hypertension: Secondary | ICD-10-CM | POA: Diagnosis not present

## 2016-03-07 LAB — COMPREHENSIVE METABOLIC PANEL
ALT: 18 U/L (ref 0–35)
AST: 15 U/L (ref 0–37)
Albumin: 4.2 g/dL (ref 3.5–5.2)
Alkaline Phosphatase: 60 U/L (ref 39–117)
BUN: 17 mg/dL (ref 6–23)
CHLORIDE: 103 meq/L (ref 96–112)
CO2: 31 meq/L (ref 19–32)
Calcium: 9.1 mg/dL (ref 8.4–10.5)
Creatinine, Ser: 0.76 mg/dL (ref 0.40–1.20)
GFR: 82.53 mL/min (ref >60.00)
GLUCOSE: 102 mg/dL — AB (ref 70–99)
POTASSIUM: 3.6 meq/L (ref 3.5–5.1)
SODIUM: 140 meq/L (ref 135–145)
TOTAL PROTEIN: 7.1 g/dL (ref 6.0–8.3)
Total Bilirubin: 0.4 mg/dL (ref 0.2–1.2)

## 2016-03-07 LAB — HEMOGLOBIN A1C: HEMOGLOBIN A1C: 5.7 % (ref 4.6–6.5)

## 2016-03-07 LAB — LIPID PANEL
CHOL/HDL RATIO: 5
CHOLESTEROL: 235 mg/dL — AB (ref 0–200)
HDL: 52.1 mg/dL (ref >39.00)
LDL CALC: 164 mg/dL — AB (ref 0–99)
NONHDL: 183.01
Triglycerides: 97 mg/dL (ref 0.0–149.0)
VLDL: 19.4 mg/dL (ref 0.0–40.0)

## 2016-03-07 LAB — CBC
HEMATOCRIT: 37.3 % (ref 36.0–46.0)
HEMOGLOBIN: 12.6 g/dL (ref 12.0–15.0)
MCHC: 33.9 g/dL (ref 30.0–36.0)
MCV: 86.6 fl (ref 78.0–100.0)
Platelets: 292 10*3/uL (ref 150.0–400.0)
RBC: 4.31 Mil/uL (ref 3.87–5.11)
RDW: 15.5 % (ref 11.5–15.5)
WBC: 5.8 10*3/uL (ref 4.0–10.5)

## 2016-03-07 LAB — TSH: TSH: 1.93 u[IU]/mL (ref 0.35–4.50)

## 2016-03-07 LAB — VITAMIN D 25 HYDROXY (VIT D DEFICIENCY, FRACTURES): VITD: 33.37 ng/mL (ref 30.00–100.00)

## 2016-03-07 LAB — VITAMIN B12: Vitamin B-12: 374 pg/mL (ref 211–911)

## 2016-03-13 NOTE — Progress Notes (Signed)
CARDIOLOGY OFFICE NOTE  Date:  03/14/2016    Leslie Noble Date of Birth: 11/27/1955 Medical Record [id number]  PCP:  Hoyt Koch, MD  Cardiologist:  Angelena Form (DOD)  Chief Complaint  Patient presents with  . Chest Pain    New patient visit - seen for Dr. Angelena Form (DOD)    History of Present Illness: Leslie Noble is a 61 y.o. female who presents today for a new patient visit. Seen for Dr. Angelena Form (DOD).   She has a history of HTN, HLD, OSA - on CPAP.    Referred by PCP here for atypical chest pain.   Comes in today. Here alone. 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 Very talkative. Was here about 10 years ago - may have seen Dr. Burt Knack in the past - she tells me she a had chemical stress test (because of elevated BP response with stress) which turned out ok.    She notes that she has had this intermittent chest pain off and on since she was in her 89's. She has had it attributed to gas. She describes a terrible pain/pressure in the middle of the chest - feels like an elephant sitting on her chest - will drink cold water and it goes away. Never exertional. She does body pump class and walks regularly. Never has this symptom with her exercise routine. Some palpitations - she feels like that is more anxiety related. No associated symptoms. She notes labile BP control - she does not like to "take medicines" - has stopped her Sular - does not check BP at home. No actual CAD noted in her family. Stopped smoking 32 years ago.   Past Medical History:  Diagnosis Date  . Alcohol abuse   . High cholesterol   . Hypertension   . Sleep apnea    CPAP    Past Surgical History:  Procedure Laterality Date  . COLONOSCOPY WITH PROPOFOL N/A 12/31/2014   Procedure: COLONOSCOPY WITH PROPOFOL;  Surgeon: Mauri Pole, MD;  Location: [facility];  Service: Endoscopy;  Laterality: N/A;  . ESOPHAGOGASTRODUODENOSCOPY (EGD) WITH PROPOFOL N/A 12/31/2014   Procedure:  ESOPHAGOGASTRODUODENOSCOPY (EGD) WITH PROPOFOL;  Surgeon: Mauri Pole, MD;  Location: [facility];  Service: Endoscopy;  Laterality: N/A;  . KNEE ARTHROSCOPY Left      Medications: Current Outpatient Prescriptions  Medication Sig Dispense Refill  . calcium carbonate 1250 MG capsule Take 1,250 mg by mouth daily.    . cholecalciferol (VITAMIN D) 1000 UNITS tablet Take 1,000 Units by mouth daily.    . diclofenac sodium (VOLTAREN) 1 % GEL Apply 2 g topically 4 (four) times daily. 100 g 3  . Esomeprazole Magnesium (NEXIUM PO) Take 10 mg by mouth as needed (heartburn).    . Multiple Vitamins-Minerals (CENTRUM SILVER ULTRA WOMENS PO) Take 1 tablet by mouth daily.    . quinapril-hydrochlorothiazide (ACCURETIC) 20-12.5 MG tablet Take 2 tablets by mouth daily.     No current facility-administered medications for this visit.     Allergies: No Known Allergies  Social History: The patient  reports that she quit smoking about 34 years ago. Her smoking use included Cigarettes. She has never used smokeless tobacco. She reports that she drinks alcohol. She reports that she does not use drugs.   Family History: The patient's family history includes Alcoholism in her maternal grandfather; Barrett's esophagus in her father; Breast cancer in her mother; Cancer in her paternal grandmother; Drug abuse in her brother; GER disease in her  father; Heart disease in her mother; Hypertension in her father; Sleep apnea in her father.   Review of Systems: Please see the history of present illness.   Otherwise, the review of systems is positive for none.   All other systems are reviewed and negative.   Physical Exam: VS:  BP 120/88   Pulse 68   Ht 5' 7.5" (1.715 m)   Wt 178 lb (80.7 kg)   BMI 27.47 kg/m  .  BMI Body mass index is 27.47 kg/m.  Wt Readings from Last 3 Encounters:  03/14/16 178 lb (80.7 kg)  02/17/16 175 lb 4 oz (79.5 kg)  01/20/16 176 lb (79.8 kg)    General: Pleasant. Quite  talkative. She is alert and in no acute distress.   HEENT: Normal.  Neck: Supple, no JVD, carotid bruits, or masses noted.  Cardiac: Regular rate and rhythm. No murmurs, rubs, or gallops. No edema.  Respiratory:  Lungs are clear to auscultation bilaterally with normal work of breathing.  GI: Soft and nontender.  MS: No deformity or atrophy. Gait and ROM intact.  Skin: Warm and dry. Color is normal.  Neuro:  Strength and sensation are intact and no gross focal deficits noted.  Psych: Alert, appropriate and with normal affect.   LABORATORY DATA:  EKG:  EKG is ordered today. This demonstrates NSR with 1st degree AV block. No ST and T wave changes. Reviewed with Dr. Angelena Form (DOD)  Lab Results  Component Value Date   WBC 5.8 03/07/2016   HGB 12.6 03/07/2016   HCT 37.3 03/07/2016   PLT 292.0 03/07/2016   GLUCOSE 102 (H) 03/07/2016   CHOL 235 (H) 03/07/2016   TRIG 97.0 03/07/2016   HDL 52.10 03/07/2016   LDLDIRECT 141.1 03/05/2006   LDLCALC 164 (H) 03/07/2016   ALT 18 03/07/2016   AST 15 03/07/2016   NA 140 03/07/2016   K 3.6 03/07/2016   CL 103 03/07/2016   CREATININE 0.76 03/07/2016   BUN 17 03/07/2016   CO2 31 03/07/2016   TSH 1.93 03/07/2016   HGBA1C 5.7 03/07/2016    BNP (last 3 results) No results for input(s): BNP in the last 8760 hours.  ProBNP (last 3 results) No results for input(s): PROBNP in the last 8760 hours.   Other Studies Reviewed Today:   Assessment/Plan:  1. Chest pain - chronic for many years - some risk factors which include HTN and HLD. No early FH of heart disease - will arrange for GXT and echo.   2. HTN - BP by me is 140/100 - not really clear what kind of control she actually has - she has a cuff at home - will need to start monitoring. Only taking Accuretic 40/25 and not on her Sular. She will let us know. Goal is 130/80 or less. Explained how inadequate BP control long term leads to CHF. Will get echo to look for LVH.   3. HLD - recent  values noted.  She is not wanting to take statin therapy.   4. Obesity - discussed. Encouraged weight loss.    Current medicines are reviewed with the patient today.  The patient does not have concerns regarding medicines other than what has been noted above.  The following changes have been made:  See above.  Labs/ tests ordered today include:   No orders of the defined types were placed in this encounter.    Disposition:   Further disposition pending. Discussed with Dr. Angelena Form who is in agreement with  this plan. Will see how her studies turn out.   Patient is agreeable to this plan and will call if any problems develop in the interim.   SignedTruitt Merle, NP  03/14/2016 3:20 PM  [facility] [address] [place], [place]  [address] Phone: [phone] Fax: [phone]

## 2016-03-14 ENCOUNTER — Ambulatory Visit (INDEPENDENT_AMBULATORY_CARE_PROVIDER_SITE_OTHER): Payer: BLUE CROSS/BLUE SHIELD | Admitting: Nurse Practitioner

## 2016-03-14 ENCOUNTER — Encounter: Payer: Self-pay | Admitting: Nurse Practitioner

## 2016-03-14 VITALS — BP 120/88 | HR 68 | Ht 67.5 in | Wt 178.0 lb

## 2016-03-14 DIAGNOSIS — I10 Essential (primary) hypertension: Secondary | ICD-10-CM | POA: Diagnosis not present

## 2016-03-14 DIAGNOSIS — R0789 Other chest pain: Secondary | ICD-10-CM

## 2016-03-14 NOTE — Patient Instructions (Addendum)
We will be checking the following labs today - NONE   Medication Instructions:    Continue with your current medicines.     Testing/Procedures To Be Arranged:  Echocardiogram  GXT  Follow-Up:   Will see how your studies turn out and then decide about follow up    Other Special Instructions:   Monitor your BP - keep a record.     If you need a refill on your cardiac medications before your next appointment, please call your pharmacy.   Call the [place] office at [phone] if you have any questions, problems or concerns.

## 2016-03-15 ENCOUNTER — Encounter: Payer: Self-pay | Admitting: Nurse Practitioner

## 2016-03-16 ENCOUNTER — Telehealth: Payer: Self-pay

## 2016-03-16 ENCOUNTER — Encounter: Payer: Self-pay | Admitting: Internal Medicine

## 2016-03-16 MED ORDER — ATENOLOL 50 MG PO TABS: 50.0000 mg | ORAL_TABLET | Freq: Every day | ORAL | 3 refills | Status: DC

## 2016-03-16 NOTE — Telephone Encounter (Signed)
Follow up   Pt called she stated she has not heard back from anyone and she really needs to know what to do

## 2016-03-16 NOTE — Telephone Encounter (Signed)
Truitt Merle, NP is not in the office, and per DOD Dr. Darnelle Bos recommendations from earlier today (see below), patient was advised to follow up with PCP. Patient verbalized understanding.

## 2016-03-16 NOTE — Telephone Encounter (Signed)
Called patient about her MyChart message as written below. Since Truitt Merle NP is not in the office today, consulted DOD, Dr. Saunders Revel. Dr. Saunders Revel is concerned with patient going out of country and requesting xanax. Dr. Saunders Revel recommend patient to contact her PCP, that they can address BP and her anxiety. Patient was not satisfied with this answer and she wants to hear from [place]. Informed patient that message would be sent to Truitt Merle NP. Informed patient that if she does not hear back from [place] or our office by mid afternoon to give our office a call. Informed patient also to contact her PCP, that Dr. Sharlet Salina can review [facility] office visit note in her chart. Patient stated she would wait to hear back from Lucius Conn NP at this time.   "Hi Leslie Noble;  I enjoyed meeting you yesterday and appreciate your advice and counsel. I am taking it to "heart" (no pun intended), however, I am worried about my blood pressure. Today I recorded it 4 times. These were the readings I got:   10:00 am: 186/103  10:05 am: 174/96  6:45 pm: 183/100  6:48 pm: 156/98  I know these numbers are horrible and I don't think I should wait any longer. I worry that this has been the case without me being aware of it, as I was not checking my BP often. Please prescribe a blood pressure medication for me in addition to the Quinapril-HCTZ that I am currently taking. I don't think the Quinapril-HCTZ, by itself, is sufficient at this point. And could you please add a prescription for xanax. This is making me very stressed. If you could expedite this, as I am leaving the country on Monday 2/5. Sorry for any inconvenience.  Thank you and kind regards,  Leslie Noble  [phone] "

## 2016-03-23 ENCOUNTER — Encounter: Payer: Self-pay | Admitting: Internal Medicine

## 2016-04-04 ENCOUNTER — Encounter: Payer: Self-pay | Admitting: Internal Medicine

## 2016-04-04 ENCOUNTER — Ambulatory Visit (INDEPENDENT_AMBULATORY_CARE_PROVIDER_SITE_OTHER): Payer: BLUE CROSS/BLUE SHIELD | Admitting: Internal Medicine

## 2016-04-04 DIAGNOSIS — I10 Essential (primary) hypertension: Secondary | ICD-10-CM

## 2016-04-04 DIAGNOSIS — R55 Syncope and collapse: Secondary | ICD-10-CM | POA: Diagnosis not present

## 2016-04-04 MED ORDER — AMLODIPINE BESYLATE 10 MG PO TABS: 10.0000 mg | ORAL_TABLET | Freq: Every day | ORAL | 3 refills | Status: DC

## 2016-04-04 MED ORDER — ALPRAZOLAM 0.5 MG PO TABS: 0.5000 mg | ORAL_TABLET | Freq: Every evening | ORAL | 0 refills | Status: DC | PRN

## 2016-04-04 NOTE — Progress Notes (Signed)
Pre visit review using our clinic review tool, if applicable. No additional management support is needed unless otherwise documented below in the visit note. 

## 2016-04-04 NOTE — Assessment & Plan Note (Addendum)
Sounds to be vasovagal syncope with beta blocker and decreased nutrition and water. She was also in a tropical climate and likely more water loss from sweating. She has not had recurrent symptoms since stopping atenolol and returning to normal activity even with exercise. She does need more BP therapy and will stop atenolol and change to amlodipine. Since she was unable to check BP at the time of incident it is unclear if the syncope was related to low BP and unsafe to continue the atenolol.

## 2016-04-04 NOTE — Patient Instructions (Signed)
We have changed the atenolol to amlodipine. Take 1 pill daily of the amlodipine to help with blood pressure.   Keep track of the log as well.   We have sent in the xanax.

## 2016-04-04 NOTE — Assessment & Plan Note (Signed)
Change atenolol to amlodipine due to side effects. She will continue quinapril/hctz as well. Getting a stress test and echo soon through cardiology so will watch those results.

## 2016-04-04 NOTE — Progress Notes (Signed)
   Subjective:    Patient ID: Leslie Noble, female    DOB: May 12, 1955, 61 y.o.   MRN: [id number]  HPI The patient is a 61 YO female coming in for problems with her medication over the vacation. She started on the atenolol 50 mg daily. She was on vacation in a remote location in [place] and not eating well. She was not drinking as much liquids as usual. She felt dizzy and had several episodes of syncope. Better with lying flat and elevating her legs. She then stopped taking the medication. This improved her symptoms over the next 1-2 days. She hs not experienced these symptoms since returning home. She has not been taking atenolol. She has been taking acupril. She is logging her BPs at home and some in the 180s/90s, lowest is 135/80. Mostly in the 150s-160s. She denies headaches, chest pains, SOB. She is scheduled for stress test and echo later this month and early next month.   Review of Systems  Constitutional: Positive for activity change. Negative for appetite change, fatigue, fever and unexpected weight change.  HENT: Negative.   Eyes: Negative.   Respiratory: Negative.   Cardiovascular: Negative.   Gastrointestinal: Negative.   Musculoskeletal: Negative.   Skin: Negative.   Neurological: Positive for syncope and light-headedness. Negative for dizziness, tremors, seizures, facial asymmetry, speech difficulty, weakness, numbness and headaches.  Psychiatric/Behavioral: Negative.       Objective:   Physical Exam  Constitutional: She is oriented to person, place, and time. She appears well-developed and well-nourished.  HENT:  Head: Normocephalic and atraumatic.  Right Ear: External ear normal.  Left Ear: External ear normal.  Eyes: EOM are normal.  Neck: Normal range of motion.  Cardiovascular: Normal rate and regular rhythm.   Pulmonary/Chest: Effort normal and breath sounds normal. No respiratory distress. She has no wheezes. She has no rales.  Abdominal: Soft. She exhibits no  distension. There is no tenderness. There is no rebound.  Musculoskeletal: She exhibits no edema.  Neurological: She is alert and oriented to person, place, and time.  Skin: Skin is warm and dry.  Psychiatric: She has a normal mood and affect.   Vitals:   04/04/16 1116 04/04/16 1144  BP: (!) 148/98 (!) 142/82  Pulse: 76   Temp: 98.3 F (36.8 C)   TempSrc: Oral   SpO2: 99%   Weight: 172 lb (78 kg)   Height: 5' 7.5" (1.715 m)       Assessment & Plan:

## 2016-04-10 ENCOUNTER — Other Ambulatory Visit: Payer: Self-pay

## 2016-04-10 ENCOUNTER — Ambulatory Visit (HOSPITAL_COMMUNITY): Payer: BLUE CROSS/BLUE SHIELD | Attending: Cardiovascular Disease

## 2016-04-10 DIAGNOSIS — I071 Rheumatic tricuspid insufficiency: Secondary | ICD-10-CM | POA: Insufficient documentation

## 2016-04-10 DIAGNOSIS — I10 Essential (primary) hypertension: Secondary | ICD-10-CM | POA: Diagnosis not present

## 2016-04-10 DIAGNOSIS — R42 Dizziness and giddiness: Secondary | ICD-10-CM | POA: Diagnosis not present

## 2016-04-10 DIAGNOSIS — R0789 Other chest pain: Secondary | ICD-10-CM

## 2016-04-10 DIAGNOSIS — R55 Syncope and collapse: Secondary | ICD-10-CM | POA: Diagnosis not present

## 2016-04-10 DIAGNOSIS — I371 Nonrheumatic pulmonary valve insufficiency: Secondary | ICD-10-CM | POA: Diagnosis not present

## 2016-04-10 DIAGNOSIS — I340 Nonrheumatic mitral (valve) insufficiency: Secondary | ICD-10-CM | POA: Insufficient documentation

## 2016-04-11 ENCOUNTER — Other Ambulatory Visit: Payer: Self-pay | Admitting: *Deleted

## 2016-04-11 ENCOUNTER — Telehealth: Payer: Self-pay | Admitting: Nurse Practitioner

## 2016-04-11 MED ORDER — AMLODIPINE BESYLATE 10 MG PO TABS: 5.0000 mg | ORAL_TABLET | Freq: Every day | ORAL | 3 refills | Status: DC

## 2016-04-11 NOTE — Telephone Encounter (Signed)
New message  ° ° ° ° °Pt is returning your call  °

## 2016-04-17 ENCOUNTER — Ambulatory Visit (INDEPENDENT_AMBULATORY_CARE_PROVIDER_SITE_OTHER): Payer: BLUE CROSS/BLUE SHIELD

## 2016-04-17 DIAGNOSIS — R0789 Other chest pain: Secondary | ICD-10-CM

## 2016-04-17 DIAGNOSIS — I10 Essential (primary) hypertension: Secondary | ICD-10-CM | POA: Diagnosis not present

## 2016-04-17 LAB — EXERCISE TOLERANCE TEST
Estimated workload: 10.1 METS
Exercise duration (min): 8 min
Exercise duration (sec): 39 s
MPHR: 160 {beats}/min
Peak HR: 139 {beats}/min
Percent HR: 86 %
RPE: 17
Rest HR: 77 {beats}/min

## 2016-04-24 ENCOUNTER — Telehealth: Payer: Self-pay | Admitting: Nurse Practitioner

## 2016-04-24 NOTE — Telephone Encounter (Signed)
Patient states that she is returning your call, thanks.

## 2016-05-26 ENCOUNTER — Encounter: Payer: Self-pay | Admitting: Nurse Practitioner

## 2016-06-20 ENCOUNTER — Encounter: Payer: Self-pay | Admitting: Internal Medicine

## 2016-07-01 ENCOUNTER — Other Ambulatory Visit: Payer: Self-pay | Admitting: Internal Medicine

## 2016-10-08 ENCOUNTER — Other Ambulatory Visit: Payer: Self-pay | Admitting: Family

## 2016-10-08 ENCOUNTER — Encounter: Payer: Self-pay | Admitting: Internal Medicine

## 2016-10-08 DIAGNOSIS — D170 Benign lipomatous neoplasm of skin and subcutaneous tissue of head, face and neck: Secondary | ICD-10-CM

## 2016-11-12 ENCOUNTER — Other Ambulatory Visit: Payer: Self-pay | Admitting: General Surgery

## 2016-11-12 DIAGNOSIS — R221 Localized swelling, mass and lump, neck: Secondary | ICD-10-CM

## 2016-11-23 ENCOUNTER — Ambulatory Visit
Admission: RE | Admit: 2016-11-23 | Discharge: 2016-11-23 | Disposition: A | Discharge status: Home / Self Care | Payer: BLUE CROSS/BLUE SHIELD | Source: Ambulatory Visit | Attending: General Surgery | Admitting: General Surgery

## 2016-11-23 DIAGNOSIS — R221 Localized swelling, mass and lump, neck: Secondary | ICD-10-CM

## 2016-11-23 MED ORDER — IOPAMIDOL (ISOVUE-300) INJECTION 61%
75.0000 mL | Freq: Once | INTRAVENOUS | Status: AC | PRN
  Administered 2016-11-23: 75 mL via INTRAVENOUS

## 2016-12-31 ENCOUNTER — Other Ambulatory Visit: Payer: Self-pay | Admitting: General Surgery

## 2016-12-31 HISTORY — PX: LIPOMA EXCISION: SHX5283

## 2017-01-01 ENCOUNTER — Other Ambulatory Visit: Payer: Self-pay | Admitting: Internal Medicine

## 2017-01-08 ENCOUNTER — Encounter: Payer: Self-pay | Admitting: Internal Medicine

## 2017-01-11 ENCOUNTER — Ambulatory Visit (INDEPENDENT_AMBULATORY_CARE_PROVIDER_SITE_OTHER): Payer: BLUE CROSS/BLUE SHIELD | Admitting: Internal Medicine

## 2017-01-11 ENCOUNTER — Other Ambulatory Visit (INDEPENDENT_AMBULATORY_CARE_PROVIDER_SITE_OTHER): Payer: BLUE CROSS/BLUE SHIELD

## 2017-01-11 ENCOUNTER — Encounter: Payer: Self-pay | Admitting: Internal Medicine

## 2017-01-11 VITALS — BP 140/82 | HR 73 | Temp 97.8°F | Ht 67.5 in | Wt 179.0 lb

## 2017-01-11 DIAGNOSIS — I6529 Occlusion and stenosis of unspecified carotid artery: Secondary | ICD-10-CM

## 2017-01-11 DIAGNOSIS — R0789 Other chest pain: Secondary | ICD-10-CM

## 2017-01-11 DIAGNOSIS — M545 Low back pain, unspecified: Secondary | ICD-10-CM | POA: Insufficient documentation

## 2017-01-11 DIAGNOSIS — G8929 Other chronic pain: Secondary | ICD-10-CM

## 2017-01-11 DIAGNOSIS — Z23 Encounter for immunization: Secondary | ICD-10-CM

## 2017-01-11 DIAGNOSIS — E041 Nontoxic single thyroid nodule: Secondary | ICD-10-CM | POA: Insufficient documentation

## 2017-01-11 DIAGNOSIS — Z1159 Encounter for screening for other viral diseases: Secondary | ICD-10-CM

## 2017-01-11 LAB — LIPID PANEL
Cholesterol: 304 mg/dL — ABNORMAL HIGH (ref 0–200)
HDL: 49.9 mg/dL (ref >39.00)
LDL Cholesterol: 219 mg/dL — ABNORMAL HIGH (ref 0–99)
NONHDL: 254.02
Total CHOL/HDL Ratio: 6
Triglycerides: 175 mg/dL — ABNORMAL HIGH (ref 0.0–149.0)
VLDL: 35 mg/dL (ref 0.0–40.0)

## 2017-01-11 LAB — TSH: TSH: 1.85 u[IU]/mL (ref 0.35–4.50)

## 2017-01-11 NOTE — Assessment & Plan Note (Signed)
5 mm cyst which is benign and does not need further workup. Reassurance given and checking TSH today.

## 2017-01-11 NOTE — Progress Notes (Signed)
   Subjective:    Patient ID: Zoila Ditullio, female    DOB: December 20, 1955, 61 y.o.   MRN: [id number]  HPI The patient is a 61 YO female coming in for several concerns including lower back pain (lower pain, denies wanting any medicine for it, worse in the morning and when sitting for a long time or with bending, x-ray in 2012 with some mild arthritis, no injury or overuse, is starting yoga but has not noticed changes yet from that, denies radiation of the pain, no numbness or change in bowel or bladder), and recent imaging findings including atherosclerosis in the carotid (no history of stroke, no numbness or weakness), and chest pains (intermittent for many years, but now with this finding she is wondering about calcium in the heart, stress test and echo without significant findings earlier this year, previous workup at onset years ago without findings, she is able to drink water and this clears the symptoms), and thyroid cyst (denies heat or cold intolerance, found on CT neck, no family history of thyroid problems or thyroid cancer).   Review of Systems    Objective:   Physical Exam Vitals:   01/11/17 1321  BP: 140/82  Pulse: 73  Temp: 97.8 F (36.6 C)  TempSrc: Oral  SpO2: 100%  Weight: 179 lb (81.2 kg)  Height: 5' 7.5" (1.715 m)      Assessment & Plan:  Tdap given at visit

## 2017-01-11 NOTE — Patient Instructions (Addendum)
We will get you in to the physical therapy. You have given you your Tdap and will call you back with lab results when they are done.

## 2017-01-11 NOTE — Assessment & Plan Note (Signed)
She refuses medications. She is referred to PT and talked to her about possible TENS unit.

## 2017-01-11 NOTE — Assessment & Plan Note (Signed)
Noted on ct neck minimal. No symptoms at this time. Checking lipid panel and adjust as needed.

## 2017-01-11 NOTE — Assessment & Plan Note (Signed)
Previously referred to cardiology for the same complaint. Explained that the minimal changes in the carotid have likely been present unchanged since her echo and stress test which were both this year. Reassurance given that these symptoms are not likely cardiac in nature and we talked about GERD and esophageal spasm as possibilities.

## 2017-01-12 LAB — HEPATITIS C ANTIBODY
HEP C AB: NONREACTIVE
SIGNAL TO CUT-OFF: 0.01 (ref <1.00)

## 2017-01-14 ENCOUNTER — Encounter: Payer: Self-pay | Admitting: Internal Medicine

## 2017-01-14 DIAGNOSIS — Z8249 Family history of ischemic heart disease and other diseases of the circulatory system: Secondary | ICD-10-CM

## 2017-01-23 ENCOUNTER — Encounter: Payer: Self-pay | Admitting: Internal Medicine

## 2017-01-29 ENCOUNTER — Ambulatory Visit: Payer: BLUE CROSS/BLUE SHIELD | Attending: Internal Medicine | Admitting: Physical Therapy

## 2017-01-29 ENCOUNTER — Encounter: Payer: Self-pay | Admitting: Physical Therapy

## 2017-01-29 DIAGNOSIS — M62830 Muscle spasm of back: Secondary | ICD-10-CM | POA: Diagnosis present

## 2017-01-29 DIAGNOSIS — M545 Low back pain, unspecified: Secondary | ICD-10-CM

## 2017-01-29 DIAGNOSIS — G8929 Other chronic pain: Secondary | ICD-10-CM | POA: Diagnosis present

## 2017-01-30 ENCOUNTER — Encounter: Payer: Self-pay | Admitting: Physical Therapy

## 2017-01-30 NOTE — Therapy (Signed)
[place], [place], [address] Phone: [phone]   Fax:  [phone]  Physical Therapy Evaluation  Patient Details  Name: Leslie Noble MRN: [id number] Date of Birth: 1955-11-20 Referring Provider: Dr Pricilla Holm    Encounter Date: 01/29/2017  PT End of Session - 01/30/17 1329    Visit Number  1    Number of Visits  16    Date for PT Re-Evaluation  03/27/17    Authorization Type  BCBCS     PT Start Time  1545    PT Stop Time  1633    PT Time Calculation (min)  48 min    Activity Tolerance  Patient tolerated treatment well    Behavior During Therapy  [facility] for tasks assessed/performed       Past Medical History:  Diagnosis Date  . Alcohol abuse   . High cholesterol   . Hypertension   . Sleep apnea    CPAP    Past Surgical History:  Procedure Laterality Date  . COLONOSCOPY WITH PROPOFOL N/A 12/31/2014   Procedure: COLONOSCOPY WITH PROPOFOL;  Surgeon: Mauri Pole, MD;  Location: [facility];  Service: Endoscopy;  Laterality: N/A;  . ESOPHAGOGASTRODUODENOSCOPY (EGD) WITH PROPOFOL N/A 12/31/2014   Procedure: ESOPHAGOGASTRODUODENOSCOPY (EGD) WITH PROPOFOL;  Surgeon: Mauri Pole, MD;  Location: [facility];  Service: Endoscopy;  Laterality: N/A;  . KNEE ARTHROSCOPY Left   . LIPOMA EXCISION Left 12/31/2016   neck    There were no vitals filed for this visit.   Subjective Assessment - 01/29/17 1602    Subjective  Patient has a long history of lower back pain. The pain has gotten worse ofver the past year to the point were it is becoming debilitating. She also had a lipoma removed in her upper trap. She feels like she is having difficulty lifting her left hand overhead. She has the most pain when she is sitting for long periods of time at work.     Limitations  Standing;Walking    How long can you sit comfortably?  < 15 minutes before she becomes stiff     How long can you stand  comfortably?  < 30 min     How long can you walk comfortably?  Back hurts less when she is walking     Patient Stated Goals  having difficulty with active lifestyle     Currently in Pain?  Yes    Pain Score  6     Pain Location  Back    Pain Orientation  Right;Left;Mid;Lower    Pain Descriptors / Indicators  Aching    Pain Type  Chronic pain    Pain Onset  More than a month ago    Pain Frequency  Rarely    Aggravating Factors   standing and sitting     Pain Relieving Factors  walking    Effect of Pain on Daily Activities  difficulty perfroming daily activity and workouts          [facility] PT Assessment - 01/30/17 0001      Assessment   Medical Diagnosis  Low back pain     Referring Provider  Dr Pricilla Holm     Onset Date/Surgical Date  -- increasing back pain for the last year     Hand Dominance  Right    Next MD Visit  None scheduled     Prior Therapy  none scheduled       Precautions  Precautions  None      Restrictions   Weight Bearing Restrictions  No      Balance Screen   Has the patient fallen in the past 6 months  No    Has the patient had a decrease in activity level because of a fear of falling?   No    Is the patient reluctant to leave their home because of a fear of falling?   No      Home Film/video editor residence      Prior Function   Level of Independence  Independent    Vocation  Full time employment    Vocation Requirements  works as Geologist, engineering; Sits for long periods of time     Leisure  yoga and body pump classes       Cognition   Overall Cognitive Status  Within Functional Limits for tasks assessed    Attention  Focused    Focused Attention  Appears intact    Memory  Appears intact    Awareness  Appears intact    Problem Solving  Appears intact      Observation/Other Assessments   Focus on Therapeutic Outcomes (FOTO)   54% limitation 34% predicted       Sensation   Additional Comments  denies radicular pain        Coordination   Gross Motor Movements are Fluid and Coordinated  Yes    Fine Motor Movements are Fluid and Coordinated  Yes      ROM / Strength   AROM / PROM / Strength  AROM;PROM;Strength      AROM   AROM Assessment Site  Lumbar    Lumbar Flexion  limited 50% with pain stiffness noted     Lumbar Extension  limited 50%     Lumbar - Right Side Bend  pain     Lumbar - Left Side Bend  no pain     Lumbar - Right Rotation  pain     Lumbar - Left Rotation  no pain       PROM   Overall PROM Comments  full PROM of the hips       Strength   Overall Strength Comments  5/5 gross bilateral lower extremity strength       Flexibility   Soft Tissue Assessment /Muscle Length  yes    Hamstrings  increased limitation on the left       Palpation   Spinal mobility  decreased PA mobility in L4-L5 L5-S1     Palpation comment  spasming of bilateral lumbar paraspinals       Special Tests    Special Tests  Lumbar    Lumbar Tests  Straight Leg Raise      Straight Leg Raise   Comment  (-) bilateral       Ambulation/Gait   Gait Comments  decreased hip rotation bilateral              Objective measurements completed on examination: See above findings.      [place] [facility] PT Treatment/Exercise - 01/30/17 0001      Lumbar Exercises: Stretches   Passive Hamstring Stretch Limitations  hamstring stretch with strap 3x20 sec     Piriformis Stretch Limitations  3x20 sec hold       Lumbar Exercises: Standing   Other Standing Lumbar Exercises  tennis ball STM to glut medius  PT Education - 01/29/17 1606    Education provided  Yes    Education Details  HEP; symptom mangement; improtance of starting exercises at a level that wont exacerbate her symptoms     Person(s) Educated  Patient    Methods  Explanation;Demonstration;Verbal cues;Tactile cues    Comprehension  Verbalized understanding;Returned demonstration;Tactile cues required;Verbal cues required       PT  Short Term Goals - 01/30/17 1356      PT SHORT TERM GOAL #1   Title  Patient will demsotrate a good core contraction     Time  4    Period  Weeks    Status  New    Target Date  02/27/17      PT SHORT TERM GOAL #2   Title  Patient will be independnet with initial HEP     Time  4    Period  Weeks    Status  New    Target Date  02/27/17      PT SHORT TERM GOAL #3   Title  Patient will report pain no worse then 2/10 with sitting     Time  4    Period  Weeks    Status  New    Target Date  02/27/17        PT Long Term Goals - 01/30/17 1357      PT LONG TERM GOAL #1   Title  Patient will stand for 1 hour without self report of pain in order to go shopping     Time  8    Period  Weeks    Status  New    Target Date  03/27/17      PT LONG TERM GOAL #2   Title  Patient will bend to put her shoes on without pain     Time  8    Period  Weeks    Status  New    Target Date  03/27/17      PT LONG TERM GOAL #3   Title  Patient will sit at work for 1 hour without increased pain in order to perfrom work tasks     Time  8    Period  Weeks    Status  New    Target Date  03/27/17      PT LONG TERM GOAL #4   Title  Patient will return to Yoga or bidy pump classess with a good understanding of how to protect her spine     Time  8    Period  Weeks    Status  New    Target Date  03/27/17             Plan - 01/30/17 1337    Clinical Impression Statement  Patient is a 61 year old female with lower back pain. Her pain increases when she sits, bends, or stands. Symptoms are consistent with a disc buldge but she dosent have a positve streight leg raise or radicular symptoms. She report some improvement with pain with prone positinng.  she has spasming in her lumbar spine. She would benefit from dry needling but would like to try some other treatments before. She was doing yoga but it hurt her back. She would benefit from a graded core strengthening program with an eventual return to  Yoga or whatever exercise classes she would like to do. She was seen for a low complexity evaluation. At this time she does not have any strength  defcits in her hips or legs. She feels like she may need therapy at some point for her left upper trap. Her MD is retiring. she will require another script.     History and Personal Factors relevant to plan of care:  HTN     Clinical Presentation  Stable    Clinical Decision Making  Low    Rehab Potential  Good    PT Frequency  2x / week    PT Duration  8 weeks    PT Treatment/Interventions  ADLs/Self Care Home Management;Cryotherapy;Electrical Stimulation;Ultrasound;Moist Heat;Iontophoresis 4mg /ml Dexamethasone;Gait training;Stair training;Therapeutic activities;Therapeutic exercise;Patient/family education;Neuromuscular re-education;Manual techniques;Passive range of motion;Dry needling;Taping;Splinting    PT Next Visit Plan  soft tissue mobilization ot lumbar spine; prone positioning with progression to press-ups if she finds benefit; review stretching; add light core strengthening  Marching with TA; bridge with TA; clamshell with TA or whatever she can tolerate. consdider modalites as tolerated.     PT Home Exercise Plan  piriformis stretch; hasmtring stretch; prone on elbows;        Patient will benefit from skilled therapeutic intervention in order to improve the following deficits and impairments:     Visit Diagnosis: Chronic bilateral low back pain without sciatica  Muscle spasm of back     Problem List Patient Active Problem List   Diagnosis Date Noted  . Benign thyroid cyst 01/11/2017  . Low back pain 01/11/2017  . Carotid atherosclerosis 01/11/2017  . Syncope 04/04/2016  . Lipoma of neck 02/17/2016  . Atypical chest pain 02/17/2016  . Essential hypertension 01/20/2016  . Alcohol use 01/20/2016  . Heme positive stool   . GERD (gastroesophageal reflux disease) 12/10/2014    Carney Living PT DPT  01/30/2017, 2:14 PM  [facility] [address] [place], [place], [address] Phone: [phone]   Fax:  [phone]  Name: Leslie Noble MRN: [id number] Date of Birth: 1955-05-08

## 2017-02-13 ENCOUNTER — Ambulatory Visit: Payer: BLUE CROSS/BLUE SHIELD | Attending: Internal Medicine | Admitting: Physical Therapy

## 2017-02-13 DIAGNOSIS — M545 Low back pain: Secondary | ICD-10-CM | POA: Insufficient documentation

## 2017-02-13 DIAGNOSIS — M62830 Muscle spasm of back: Secondary | ICD-10-CM | POA: Diagnosis present

## 2017-02-13 DIAGNOSIS — G8929 Other chronic pain: Secondary | ICD-10-CM | POA: Insufficient documentation

## 2017-02-13 NOTE — Therapy (Signed)
[place], [place], [address] Phone: [phone]   Fax:  [phone]  Physical Therapy Treatment  Patient Details  Name: Leslie Noble MRN: [id number] Date of Birth: 08/27/1955 Referring Provider: Dr Pricilla Holm    Encounter Date: 02/13/2017  PT End of Session - 02/13/17 1650    Visit Number  2    Number of Visits  16    Date for PT Re-Evaluation  03/27/17    PT Start Time  5277    PT Stop Time  1638    PT Time Calculation (min)  53 min    Activity Tolerance  Patient tolerated treatment well    Behavior During Therapy  [name] for tasks assessed/performed       Past Medical History:  Diagnosis Date  . Alcohol abuse   . High cholesterol   . Hypertension   . Sleep apnea    CPAP    Past Surgical History:  Procedure Laterality Date  . COLONOSCOPY WITH PROPOFOL N/A 12/31/2014   Procedure: COLONOSCOPY WITH PROPOFOL;  Surgeon: Mauri Pole, MD;  Location: [facility];  Service: Endoscopy;  Laterality: N/A;  . ESOPHAGOGASTRODUODENOSCOPY (EGD) WITH PROPOFOL N/A 12/31/2014   Procedure: ESOPHAGOGASTRODUODENOSCOPY (EGD) WITH PROPOFOL;  Surgeon: Mauri Pole, MD;  Location: [facility];  Service: Endoscopy;  Laterality: N/A;  . KNEE ARTHROSCOPY Left   . LIPOMA EXCISION Left 12/31/2016   neck    There were no vitals filed for this visit.                   [place] Adult PT Treatment/Exercise - 02/13/17 0001      Lumbar Exercises: Supine   Clam Limitations  green 2x10 with cuing for breathing     Bent Knee Raise Limitations  2x10 with cuoing for breathing     Bridge Limitations  x10 with cuing for breathing       Modalities   Modalities  Moist Heat      Moist Heat Therapy   Number Minutes Moist Heat  10 Minutes    Moist Heat Location  Lumbar Spine      Manual Therapy   Manual Therapy  Soft tissue mobilization;Joint mobilization    Joint Mobilization  PA mobilizations  form L3 to L5     Soft tissue mobilization  IASTYM to biulateral lumbar paraspinals       Trigger Point Dry Needling - 02/13/17 1647    Longissimus Response  Twitch response elicited             PT Short Term Goals - 01/30/17 1356      PT SHORT TERM GOAL #1   Title  Patient will demsotrate a good core contraction     Time  4    Period  Weeks    Status  New    Target Date  02/27/17      PT SHORT TERM GOAL #2   Title  Patient will be independnet with initial HEP     Time  4    Period  Weeks    Status  New    Target Date  02/27/17      PT SHORT TERM GOAL #3   Title  Patient will report pain no worse then 2/10 with sitting     Time  4    Period  Weeks    Status  New    Target Date  02/27/17  PT Long Term Goals - 01/30/17 1357      PT LONG TERM GOAL #1   Title  Patient will stand for 1 hour without self report of pain in order to go shopping     Time  8    Period  Weeks    Status  New    Target Date  03/27/17      PT LONG TERM GOAL #2   Title  Patient will bend to put her shoes on without pain     Time  8    Period  Weeks    Status  New    Target Date  03/27/17      PT LONG TERM GOAL #3   Title  Patient will sit at work for 1 hour without increased pain in order to perfrom work tasks     Time  8    Period  Weeks    Status  New    Target Date  03/27/17      PT LONG TERM GOAL #4   Title  Patient will return to Yoga or bidy pump classess with a good understanding of how to protect her spine     Time  8    Period  Weeks    Status  New    Target Date  03/27/17            Plan - 02/13/17 1651    Clinical Impression Statement  Patient had a good twitch respose to her third needle in her umbar paraspinals. She reported some post needle soreness. Therapy educated her on ways to decrease post needle soreness. Therapy also gave the patient basic core strengthening exercises. She had a minor increase in pain with bridgin but no pain with the other  exercises. She will be seen agian for treatment tomorrow.     Clinical Decision Making  Low    Rehab Potential  Good    PT Frequency  2x / week    PT Duration  8 weeks    PT Treatment/Interventions  ADLs/Self Care Home Management;Cryotherapy;Electrical Stimulation;Ultrasound;Moist Heat;Iontophoresis 4mg /ml Dexamethasone;Gait training;Stair training;Therapeutic activities;Therapeutic exercise;Patient/family education;Neuromuscular re-education;Manual techniques;Passive range of motion;Dry needling;Taping;Splinting    PT Next Visit Plan  Did not have radiuclar pain today but review prone exercises if she does. Continue with soft tissue mobilization to the lumbar paraspinals and consider the quadratus oif sore. Patient may be tender from TPDN. Review technique and breathing with new exercises. Consider bird dog exercise if patient is not sore from yesterdays treatment.     PT Home Exercise Plan  piriformis stretch; hasmtring stretch; prone on elbows;     Consulted and Agree with Plan of Care  Patient       Patient will benefit from skilled therapeutic intervention in order to improve the following deficits and impairments:     Visit Diagnosis: Chronic bilateral low back pain without sciatica  Muscle spasm of back     Problem List Patient Active Problem List   Diagnosis Date Noted  . Benign thyroid cyst 01/11/2017  . Low back pain 01/11/2017  . Carotid atherosclerosis 01/11/2017  . Syncope 04/04/2016  . Lipoma of neck 02/17/2016  . Atypical chest pain 02/17/2016  . Essential hypertension 01/20/2016  . Alcohol use 01/20/2016  . Heme positive stool   . GERD (gastroesophageal reflux disease) 12/10/2014    Carney Living  PT DPT  02/13/2017, 4:55 PM  [place], [place],  [address] Phone: [phone]   Fax:  [phone]  Name: Leslie Noble MRN: [id number] Date of Birth: 07-19-1955

## 2017-02-14 ENCOUNTER — Encounter: Payer: Self-pay | Admitting: Physical Therapy

## 2017-02-14 ENCOUNTER — Ambulatory Visit: Payer: BLUE CROSS/BLUE SHIELD | Admitting: Physical Therapy

## 2017-02-14 DIAGNOSIS — M545 Low back pain: Secondary | ICD-10-CM | POA: Diagnosis not present

## 2017-02-14 DIAGNOSIS — M62830 Muscle spasm of back: Secondary | ICD-10-CM

## 2017-02-14 DIAGNOSIS — G8929 Other chronic pain: Secondary | ICD-10-CM

## 2017-02-14 NOTE — Therapy (Signed)
[place], [place], [address] Phone: [phone]   Fax:  [phone]  Physical Therapy Treatment  Patient Details  Name: Leslie Noble MRN: [id number] Date of Birth: 1955/10/01 Referring Provider: Dr Pricilla Holm    Encounter Date: 02/14/2017  PT End of Session - 02/14/17 1755    Visit Number  3    Number of Visits  16    Date for PT Re-Evaluation  03/27/17    PT Start Time  1548    PT Stop Time  1643    PT Time Calculation (min)  55 min    Activity Tolerance  Patient tolerated treatment well    Behavior During Therapy  [facility] for tasks assessed/performed       Past Medical History:  Diagnosis Date  . Alcohol abuse   . High cholesterol   . Hypertension   . Sleep apnea    CPAP    Past Surgical History:  Procedure Laterality Date  . COLONOSCOPY WITH PROPOFOL N/A 12/31/2014   Procedure: COLONOSCOPY WITH PROPOFOL;  Surgeon: Mauri Pole, MD;  Location: [facility];  Service: Endoscopy;  Laterality: N/A;  . ESOPHAGOGASTRODUODENOSCOPY (EGD) WITH PROPOFOL N/A 12/31/2014   Procedure: ESOPHAGOGASTRODUODENOSCOPY (EGD) WITH PROPOFOL;  Surgeon: Mauri Pole, MD;  Location: [facility];  Service: Endoscopy;  Laterality: N/A;  . KNEE ARTHROSCOPY Left   . LIPOMA EXCISION Left 12/31/2016   neck    There were no vitals filed for this visit.  Subjective Assessment - 02/14/17 1752    Subjective  Pain less post session.  No new goals met.  Manual focus today.    Currently in Pain?  Yes    Pain Score  6     Pain Location  Back    Pain Orientation  Right;Mid;Lower    Pain Descriptors / Indicators  Aching;Dull    Pain Type  Chronic pain    Pain Frequency  Intermittent    Aggravating Factors   long days at work    Pain Relieving Factors  walking,    Effect of Pain on Daily Activities  not back to workouts    Multiple Pain Sites  No                      [facility] Adult PT  Treatment/Exercise - 02/14/17 0001      Lumbar Exercises: Stretches   Piriformis Stretch  2 reps;20 seconds good motion      Modalities   Modalities  Moist Heat      Moist Heat Therapy   Number Minutes Moist Heat  10 Minutes    Moist Heat Location  Lumbar Spine      Manual Therapy   Manual Therapy  Soft tissue mobilization;Myofascial release    Manual therapy comments  low back / upper gluteals  tissue softened.    Myofascial Release  instrument assist sacrum       Trigger Point Dry Needling - 02/13/17 1647    Longissimus Response  Twitch response elicited           PT Education - 02/14/17 1755    Education provided  No       PT Short Term Goals - 02/14/17 1759      PT SHORT TERM GOAL #1   Title  Patient will demsotrate a good core contraction     Time  4    Period  Weeks    Status  Unable to assess  PT SHORT TERM GOAL #2   Title  Patient will be independnet with initial HEP     Baseline  independent with stretching    Time  4    Period  Weeks    Status  On-going      PT SHORT TERM GOAL #3   Title  Patient will report pain no worse then 2/10 with sitting     Baseline  pain 5/10    Time  4    Status  On-going        PT Long Term Goals - 01/30/17 1357      PT LONG TERM GOAL #1   Title  Patient will stand for 1 hour without self report of pain in order to go shopping     Time  8    Period  Weeks    Status  New    Target Date  03/27/17      PT LONG TERM GOAL #2   Title  Patient will bend to put her shoes on without pain     Time  8    Period  Weeks    Status  New    Target Date  03/27/17      PT LONG TERM GOAL #3   Title  Patient will sit at work for 1 hour without increased pain in order to perfrom work tasks     Time  8    Period  Weeks    Status  New    Target Date  03/27/17      PT LONG TERM GOAL #4   Title  Patient will return to Yoga or bidy pump classess with a good understanding of how to protect her spine     Time  8     Period  Weeks    Status  New    Target Date  03/27/17            Plan - 02/14/17 1755    Clinical Impression Statement  Soreness has returned today.  manual focus ,  tissue softened.  QL non tender today however when she arrived her hips were shifted to the left with a slight forward trunk lean.  I will not know the extent of her response to treatment until she returns next visit. She uses good form with piriformis stretching.     PT Next Visit Plan  Did not have radiuclar pain today but review prone exercises if she does. Continue with soft tissue mobilization to the lumbar paraspinals and consider the quadratus oif sore. Patient may be tender from TPDN. Review technique and breathing with new exercises. Consider bird dog exercise if patient is not sore from yesterdays treatment.     PT Home Exercise Plan  piriformis stretch; hasmtring stretch; prone on elbows;     Consulted and Agree with Plan of Care  Patient       Patient will benefit from skilled therapeutic intervention in order to improve the following deficits and impairments:     Visit Diagnosis: Chronic bilateral low back pain without sciatica  Muscle spasm of back     Problem List Patient Active Problem List   Diagnosis Date Noted  . Benign thyroid cyst 01/11/2017  . Low back pain 01/11/2017  . Carotid atherosclerosis 01/11/2017  . Syncope 04/04/2016  . Lipoma of neck 02/17/2016  . Atypical chest pain 02/17/2016  . Essential hypertension 01/20/2016  . Alcohol use 01/20/2016  . Heme positive stool   .  GERD (gastroesophageal reflux disease) 12/10/2014    Leslie Noble,Leslie Noble  PTA 02/14/2017, 6:01 PM  [facility] [address] [place], [place], [address] Phone: [phone]   Fax:  [phone]  Name: Leslie Noble MRN: [id number] Date of Birth: 1955/09/09

## 2017-02-18 ENCOUNTER — Ambulatory Visit: Payer: BLUE CROSS/BLUE SHIELD | Admitting: Physical Therapy

## 2017-02-20 ENCOUNTER — Ambulatory Visit: Payer: BLUE CROSS/BLUE SHIELD | Admitting: Physical Therapy

## 2017-02-20 DIAGNOSIS — M545 Low back pain: Principal | ICD-10-CM

## 2017-02-20 DIAGNOSIS — M62830 Muscle spasm of back: Secondary | ICD-10-CM

## 2017-02-20 DIAGNOSIS — G8929 Other chronic pain: Secondary | ICD-10-CM

## 2017-02-21 ENCOUNTER — Encounter: Payer: Self-pay | Admitting: Physical Therapy

## 2017-02-21 NOTE — Therapy (Signed)
[place], [place], [address] Phone: [phone]   Fax:  [phone]  Physical Therapy Treatment  Patient Details  Name: Leslie Noble MRN: [id number] Date of Birth: [birth year] Referring Provider: Dr Pricilla Holm    Encounter Date: 02/20/2017  PT End of Session - 02/21/17 1046    Visit Number  4    Number of Visits  16    Date for PT Re-Evaluation  03/27/17    Authorization Type  BCBCS     PT Start Time  1545    PT Stop Time  1633    PT Time Calculation (min)  48 min    Activity Tolerance  Patient tolerated treatment well    Behavior During Therapy  [facility] for tasks assessed/performed       Past Medical History:  Diagnosis Date  . Alcohol abuse   . High cholesterol   . Hypertension   . Sleep apnea    CPAP    Past Surgical History:  Procedure Laterality Date  . COLONOSCOPY WITH PROPOFOL N/A 12/31/2014   Procedure: COLONOSCOPY WITH PROPOFOL;  Surgeon: Mauri Pole, MD;  Location: [facility];  Service: Endoscopy;  Laterality: N/A;  . ESOPHAGOGASTRODUODENOSCOPY (EGD) WITH PROPOFOL N/A 12/31/2014   Procedure: ESOPHAGOGASTRODUODENOSCOPY (EGD) WITH PROPOFOL;  Surgeon: Mauri Pole, MD;  Location: [facility];  Service: Endoscopy;  Laterality: N/A;  . KNEE ARTHROSCOPY Left   . LIPOMA EXCISION Left 12/31/2016   neck    There were no vitals filed for this visit.  Subjective Assessment - 02/21/17 1041    Subjective  Patient reports her back is back to hurting her as much as it was beofre. She is did one yoga class and her stretches but she has not done any exercises.     Limitations  Standing;Walking    How long can you sit comfortably?  < 15 minutes before she becomes stiff     How long can you stand comfortably?  < 30 min     How long can you walk comfortably?  Back hurts less when she is walking                       [facility] Adult PT Treatment/Exercise - 02/21/17  0001      Lumbar Exercises: Stretches   Passive Hamstring Stretch Limitations  hamstring stretch with strap 3x20 sec     Piriformis Stretch  2 reps;20 seconds good motion      Lumbar Exercises: Supine   Clam Limitations  green 2x10 with cuing for breathing     Bent Knee Raise Limitations  2x10 with cuoing for breathing     Bridge Limitations  x10 with cuing for breathing can only perfrom a quater birdge      Lumbar Exercises: Quadruped   Other Quadruped Lumbar Exercises  alt UE /E x5 mod cuing for proper posture                PT Short Term Goals - 02/21/17 1044      PT SHORT TERM GOAL #1   Title  Patient will demsotrate a good core contraction     Baseline  still fair     Time  4    Period  Weeks    Status  On-going      PT SHORT TERM GOAL #2   Title  Patient will be independnet with initial HEP     Baseline  independent with  stretching    Time  4    Period  Weeks    Status  On-going      PT SHORT TERM GOAL #3   Title  Patient will report pain no worse then 2/10 with sitting     Baseline  pain 5/10    Time  4    Period  Weeks    Status  On-going        PT Long Term Goals - 01/30/17 1357      PT LONG TERM GOAL #1   Title  Patient will stand for 1 hour without self report of pain in order to go shopping     Time  8    Period  Weeks    Status  New    [employer] Date  03/27/17      PT LONG TERM GOAL #2   Title  Patient will bend to put her shoes on without pain     Time  8    Period  Weeks    Status  New    [employer] Date  03/27/17      PT LONG TERM GOAL #3   Title  Patient will sit at work for 1 hour without increased pain in order to perfrom work tasks     Time  8    Period  Weeks    Status  New    [employer] Date  03/27/17      PT LONG TERM GOAL #4   Title  Patient will return to Yoga or bidy pump classess with a good understanding of how to protect her spine     Time  8    Period  Weeks    Status  New    [employer] Date  03/27/17             Plan - 02/21/17 1041    Clinical Impression Statement  Patient feels like she is strong enough to perfrom yoga and go n bike rides but she has difficulty pefroming a bridge. She was advised at this time she needs to start with lighter exercises. She is frustrated because she used to do harder exercises. Therapy educated the aptient that if her back is irritated and she keeps doing what she was doing before she will continue to have pain and make her back worse. patiewnt given 4 core exercises to concentrate on. She will be going out of town for 2-3 weeks. She was also advised to continue with soft tissue mobilization. Therapy needled patients glut medius in two spots. She had a good twitch respose with the second needle.     Clinical Presentation  Stable    Clinical Decision Making  Low    Rehab Potential  Good    PT Frequency  2x / week    PT Duration  8 weeks    PT Treatment/Interventions  ADLs/Self Care Home Management;Cryotherapy;Electrical Stimulation;Ultrasound;Moist Heat;Iontophoresis 4mg /ml Dexamethasone;Gait training;Stair training;Therapeutic activities;Therapeutic exercise;Patient/family education;Neuromuscular re-education;Manual techniques;Passive range of motion;Dry needling;Taping;Splinting    PT Next Visit Plan  continue with soft tissue mobilization; review rexercises.     PT Home Exercise Plan  piriformis stretch; hasmtring stretch; prone on elbows;     Consulted and Agree with Plan of Care  Patient       Patient will benefit from skilled therapeutic intervention in order to improve the following deficits and impairments:  Pain, Increased muscle spasms, Decreased activity tolerance, Increased fascial restricitons, Postural dysfunction, Difficulty walking  Visit Diagnosis: Chronic bilateral  low back pain without sciatica  Muscle spasm of back     Problem List Patient Active Problem List   Diagnosis Date Noted  . Benign thyroid cyst 01/11/2017  . Low back  pain 01/11/2017  . Carotid atherosclerosis 01/11/2017  . Syncope 04/04/2016  . Lipoma of neck 02/17/2016  . Atypical chest pain 02/17/2016  . Essential hypertension 01/20/2016  . Alcohol use 01/20/2016  . Heme positive stool   . GERD (gastroesophageal reflux disease) 12/10/2014    Carney Living PT DPT  02/21/2017, 10:52 AM  [facility] [address] [place], [place], [address] Phone: [phone]   Fax:  [phone]  Name: Leslie Noble MRN: [id number] Date of Birth: 09/06/1955

## 2017-03-11 ENCOUNTER — Ambulatory Visit (INDEPENDENT_AMBULATORY_CARE_PROVIDER_SITE_OTHER): Payer: BLUE CROSS/BLUE SHIELD | Admitting: Nurse Practitioner

## 2017-03-11 ENCOUNTER — Encounter: Payer: Self-pay | Admitting: Nurse Practitioner

## 2017-03-11 VITALS — BP 170/90 | HR 76 | Ht 67.5 in | Wt 179.8 lb

## 2017-03-11 DIAGNOSIS — R079 Chest pain, unspecified: Secondary | ICD-10-CM

## 2017-03-11 DIAGNOSIS — I10 Essential (primary) hypertension: Secondary | ICD-10-CM | POA: Diagnosis not present

## 2017-03-11 DIAGNOSIS — R9431 Abnormal electrocardiogram [ECG] [EKG]: Secondary | ICD-10-CM | POA: Diagnosis not present

## 2017-03-11 MED ORDER — METOPROLOL TARTRATE 50 MG PO TABS: 50.0000 mg | ORAL_TABLET | Freq: Once | ORAL | 0 refills | Status: DC

## 2017-03-11 NOTE — Progress Notes (Signed)
CARDIOLOGY OFFICE NOTE  Date:  03/11/2017    Leslie Noble Date of Birth: 14-Dec-1955 Medical Record #[id number]  PCP:  Hoyt Koch, MD  Cardiologist:  Servando Snare   Chief Complaint  Patient presents with  . Hypertension  . Hyperlipidemia    1 year check     History of Present Illness: Leslie Noble is a 62 y.o. female who presents today for a follow up visit. She has a history of HTN, HLD, OSA - on CPAP.    I saw her in January of 2018 - was having atypical chest pain. BP was up. We got a GXT and updated her echo.   Comes back today. Here alone. Lots of issues. Worried about her most recent lipid panel. Asking about getting a calcium scoring. Had a CT scan of her neck back in the fall prior to getting a lipoma removed - CT then showed minimal atherosclerotic changes. She does not wish to take medicine at this time but is considering taking Red Yeast Rice. She continues to have chest pain - has had for years - described as a heaviness in her chest - "like an elephant sitting on me" - says that "it is gas getting trapped" - resolved with cold water and walking. Has "bad" GERD apparently. No real aerobic exercise routinely. Does do some yoga but no real regular aerobic activity on a routine basis. Does not smoke. Unclear how her BP has been - not been checking - she gets "too stressed out by checking it". No medicines yet today and admits that she forgets to take her medicines sometimes. She is currently drinking 2 glasses of wine most nights. Apparently has had issues with alcohol in the past - some of our discussion today she did not wish to but put in her record.   Past Medical History:  Diagnosis Date  . Alcohol abuse   . High cholesterol   . Hypertension   . Sleep apnea    CPAP    Past Surgical History:  Procedure Laterality Date  . COLONOSCOPY WITH PROPOFOL N/A 12/31/2014   Procedure: COLONOSCOPY WITH PROPOFOL;  Surgeon: Mauri Pole, MD;  [facility]: [facility];  Service: Endoscopy;  Laterality: N/A;  . ESOPHAGOGASTRODUODENOSCOPY (EGD) WITH PROPOFOL N/A 12/31/2014   Procedure: ESOPHAGOGASTRODUODENOSCOPY (EGD) WITH PROPOFOL;  Surgeon: Mauri Pole, MD;  [facility];  Service: Endoscopy;  Laterality: N/A;  . KNEE ARTHROSCOPY Left   . LIPOMA EXCISION Left 12/31/2016   neck     Medications: Current Meds  Medication Sig  . ALPRAZolam (XANAX) 0.5 MG tablet Take 1 tablet (0.5 mg total) by mouth at bedtime as needed for anxiety.  Marland Kitchen amLODipine (NORVASC) 10 MG tablet Take 0.5 tablets (5 mg total) by mouth daily.  Marland Kitchen CALCIUM CARBONATE-VIT D-MIN PO Take 600 mg by mouth 2 (two) times daily.   . famotidine-calcium carbonate-magnesium hydroxide (PEPCID COMPLETE) 10-800-165 MG chewable tablet Chew 1 tablet by mouth daily as needed.  . Multiple Vitamins-Minerals (CENTRUM SILVER ULTRA WOMENS PO) Take 1 tablet by mouth daily.  . quinapril-hydrochlorothiazide (ACCURETIC) 20-12.5 MG tablet Take 2 tablets daily by mouth. Needs annual visit for further refills  . zolpidem (AMBIEN) 10 MG tablet Take 10 mg by mouth at bedtime as needed for sleep.      Allergies: No Known Allergies  Social History: The patient  reports that she quit smoking about 35 years ago. Her smoking use included cigarettes. she has never used smokeless tobacco. She reports  that she drinks alcohol. She reports that she does not use drugs.   Family History: The patient's family history includes Alcoholism in her maternal grandfather; Barrett's esophagus in her father; Breast cancer in her mother; Cancer in her paternal grandmother; Drug abuse in her brother; GER disease in her father; Heart disease in her mother; Hypertension in her father; Sleep apnea in her father.   Review of Systems: Please see the history of present illness.   Otherwise, the review of systems is positive for .   All other systems are reviewed and negative.   Physical Exam: VS:  BP (!) 170/90 (BP  Location: Left Arm, Patient Position: Sitting, Cuff Size: Normal)   Pulse 76   Ht 5' 7.5" (1.715 m)   Wt 179 lb 12.8 oz (81.6 kg)   BMI 27.75 kg/m  .  BMI Body mass index is 27.75 kg/m.  Wt Readings from Last 3 Encounters:  03/11/17 179 lb 12.8 oz (81.6 kg)  01/11/17 179 lb (81.2 kg)  04/04/16 172 lb (78 kg)   BP is 200/100 by me.  General:  Very talkative. Alert and in no acute distress. She got pretty tearful during the visit today.   HEENT: Normal.  Neck: Supple, no JVD, carotid bruits, or masses noted.  Cardiac: Regular rate and rhythm. No murmurs, rubs, or gallops. No edema.  Respiratory:  Lungs are clear to auscultation bilaterally with normal work of breathing.  GI: Soft and nontender.  MS: No deformity or atrophy. Gait and ROM intact.  Skin: Warm and dry. Color is normal.  Neuro:  Strength and sensation are intact and no gross focal deficits noted.  Psych: Alert, appropriate and with normal affect.   LABORATORY DATA:  EKG:  EKG is ordered today. This demonstrates NSR with poor R wave progression.  Lab Results  Component Value Date   WBC 5.8 03/07/2016   HGB 12.6 03/07/2016   HCT 37.3 03/07/2016   PLT 292.0 03/07/2016   GLUCOSE 102 (H) 03/07/2016   CHOL 304 (H) 01/11/2017   TRIG 175.0 (H) 01/11/2017   HDL 49.90 01/11/2017   LDLDIRECT 141.1 03/05/2006   LDLCALC 219 (H) 01/11/2017   ALT 18 03/07/2016   AST 15 03/07/2016   NA 140 03/07/2016   K 3.6 03/07/2016   CL 103 03/07/2016   CREATININE 0.76 03/07/2016   BUN 17 03/07/2016   CO2 31 03/07/2016   TSH 1.85 01/11/2017   HGBA1C 5.7 03/07/2016     BNP (last 3 results) No results for input(s): BNP in the last 8760 hours.  ProBNP (last 3 results) No results for input(s): PROBNP in the last 8760 hours.   Other Studies Reviewed Today:  GXT Study Highlights 04/2016    Blood pressure demonstrated a hypertensive response to exercise.  No T wave inversion was noted during stress.  There was no ST  segment deviation noted during stress.  Overall, the patient's exercise capacity was normal  [facility] Treadmill Score: low risk   Negative stress test without evidence of ischemia at given workload.    Echo Study Conclusions 03/2016  - Left ventricle: The cavity size was normal. Wall thickness was   increased in a pattern of mild LVH. Systolic function was normal.   The estimated ejection fraction was in the range of 60% to 65%.   Wall motion was normal; there were no regional wall motion   abnormalities. Doppler parameters are consistent with abnormal   left ventricular relaxation (grade 1 diastolic dysfunction).  Doppler parameters are consistent with indeterminate ventricular   filling pressure. - Mitral valve: Transvalvular velocity was within the normal range.   There was no evidence for stenosis. There was trivial   regurgitation. - Right ventricle: The cavity size was normal. Wall thickness was   normal. Systolic function was normal. - Atrial septum: No defect or patent foramen ovale was identified   by color flow Doppler. - Tricuspid valve: There was mild regurgitation. - Pulmonary arteries: Systolic pressure was within the normal   range. PA peak pressure: 26 mm Hg (S).   Assessment/Plan:  1. Marked HLD - very hesitant about taking statin therapy - worried about liver involvement - and with her other issues (that she did not want in her record) rightfully so.   2. Chest pain - chronic for many years - remains persistent - lots of risk factors - EKG with poor R wave progression - will arrange for coronary CT. Will plan to see her back for discussion and decide about medicines.   3. HTN - BP by me is quite high - she has not had her medicines today - admits to not taking her medicines regularly - and pretty upset today - she will go home and take her medicines and recheck BP in a few hours.   4. Obesity - discussed at length again today.   5. Substance abuse    Current medicines are reviewed with the patient today.  The patient does not have concerns regarding medicines other than what has been noted above.  The following changes have been made:  See above.  Labs/ tests ordered today include:    Orders Placed This Encounter  Procedures  . CT CORONARY MORPH W/CTA COR W/SCORE W/CA W/CM &/OR WO/CM  . CT CORONARY FRACTIONAL FLOW RESERVE DATA PREP  . CT CORONARY FRACTIONAL FLOW RESERVE FLUID ANALYSIS  . EKG 12-Lead     Disposition:   FU with me after her CT scan.   Patient is agreeable to this plan and will call if any problems develop in the interim.   SignedTruitt Merle, NP  03/11/2017 4:33 PM  [facility] [address] [place], [place]  [address] Phone: [phone] Fax: [phone]

## 2017-03-11 NOTE — Patient Instructions (Addendum)
We will be checking the following labs today - NONE   Medication Instructions:    Continue with your current medicines.     Testing/Procedures To Be Arranged:  N/A  Follow-Up:   I will see you after your coronary CT for discussion    Other Special Instructions:   Go home and take your medicines  Check a BP in a few hours for me Please arrive at the [facility] main entrance of [facility] at xx:xx AM (30-45 minutes prior to test start time)  [facility] [place], [place] [address] [phone]  Proceed to the [facility] Radiology Department (First Floor).  Please follow these instructions carefully (unless otherwise directed):  On the Night Before the Test: . Drink plenty of water. . Do not consume any caffeinated/decaffeinated beverages or chocolate 12 hours prior to your test. . Do not take any antihistamines 12 hours prior to your test.  On the Day of the Test: . Drink plenty of water. Do not drink any water within one hour of the test. . Do not eat any food 4 hours prior to the test. . You may take your regular medications prior to the test. . IF NOT ON A BETA BLOCKER - Take 50 mg of lopressor (metoprolol) one hour before the test.  After the Test: . Drink plenty of water. . After receiving IV contrast, you may experience a mild flushed feeling. This is normal. . On occasion, you may experience a mild rash up to 24 hours after the test. This is not dangerous. If this occurs, you can take Benadryl 25 mg and increase your fluid intake. . If you experience trouble breathing, this can be serious. If it is severe call 911 IMMEDIATELY. If it is mild, please call our office.  If you need a refill on your cardiac medications before your next appointment, please call your pharmacy.   Call the [place] office at [phone] if you have any questions, problems or concerns.

## 2017-03-12 ENCOUNTER — Encounter: Payer: Self-pay | Admitting: Physical Therapy

## 2017-03-12 ENCOUNTER — Ambulatory Visit: Payer: BLUE CROSS/BLUE SHIELD | Admitting: Physical Therapy

## 2017-03-12 DIAGNOSIS — M62830 Muscle spasm of back: Secondary | ICD-10-CM

## 2017-03-12 DIAGNOSIS — G8929 Other chronic pain: Secondary | ICD-10-CM

## 2017-03-12 DIAGNOSIS — M545 Low back pain: Principal | ICD-10-CM

## 2017-03-13 ENCOUNTER — Other Ambulatory Visit: Payer: Self-pay | Admitting: *Deleted

## 2017-03-13 ENCOUNTER — Encounter: Payer: Self-pay | Admitting: Physical Therapy

## 2017-03-13 DIAGNOSIS — I10 Essential (primary) hypertension: Secondary | ICD-10-CM

## 2017-03-13 NOTE — Therapy (Signed)
[place], [place], [address] Phone: [phone]   Fax:  [phone]  Physical Therapy Treatment  Patient Details  Name: Leslie Noble MRN: [id number] Date of Birth: June 08, 1955 Referring Provider: Dr Pricilla Holm     Encounter Date: 03/12/2017  PT End of Session - 03/13/17 0915    Visit Number  5    Number of Visits  16    Date for PT Re-Evaluation  03/27/17    Authorization Type  BCBCS     PT Start Time  1545    PT Stop Time  1626    PT Time Calculation (min)  41 min    Activity Tolerance  Patient tolerated treatment well    Behavior During Therapy  [facility] for tasks assessed/performed       Past Medical History:  Diagnosis Date  . Alcohol abuse   . High cholesterol   . Hypertension   . Sleep apnea    CPAP    Past Surgical History:  Procedure Laterality Date  . COLONOSCOPY WITH PROPOFOL N/A 12/31/2014   Procedure: COLONOSCOPY WITH PROPOFOL;  Surgeon: Mauri Pole, MD;  Location: [facility];  Service: Endoscopy;  Laterality: N/A;  . ESOPHAGOGASTRODUODENOSCOPY (EGD) WITH PROPOFOL N/A 12/31/2014   Procedure: ESOPHAGOGASTRODUODENOSCOPY (EGD) WITH PROPOFOL;  Surgeon: Mauri Pole, MD;  [facility];  Service: Endoscopy;  Laterality: N/A;  . KNEE ARTHROSCOPY Left   . LIPOMA EXCISION Left 12/31/2016   neck    There were no vitals filed for this visit.  Subjective Assessment - 03/12/17 1619    Subjective  Patient reports her back has been better. She has gone to a lighter yoga class. She has been working on the exercises that therapy gave her. She continues to have painacross the bottom of her back but it has improved.      How long can you sit comfortably?  < 15 minutes before she becomes stiff     How long can you stand comfortably?  < 30 min     How long can you walk comfortably?  Back hurts less when she is walking     Patient Stated Goals  having difficulty with  active lifestyle     Currently in Pain?  No/denies                      [facility] Adult PT Treatment/Exercise - 03/13/17 0001      Lumbar Exercises: Stretches   Passive Hamstring Stretch Limitations  hamstring stretch with strap 3x20 sec     Piriformis Stretch  2 reps;20 seconds good motion      Lumbar Exercises: Supine   Clam Limitations  green 2x10 with cuing for breathing     Bent Knee Raise Limitations  2x10 with cuing for breathing     Bridge Limitations  2x10    Other Supine Lumbar Exercises  DKTC with ball 2x10      Modalities   Modalities  --             PT Education - 03/12/17 1620    Education provided  Yes    Education Details  reviewed exercises and updated HEP     Person(s) Educated  Patient    Methods  Explanation;Demonstration;Tactile cues;Verbal cues;Handout    Comprehension  Verbalized understanding;Returned demonstration;Verbal cues required;Tactile cues required       PT Short Term Goals - 02/21/17 1044      PT SHORT TERM  GOAL #1   Title  Patient will demsotrate a good core contraction     Baseline  still fair     Time  4    Period  Weeks    Status  On-going      PT SHORT TERM GOAL #2   Title  Patient will be independnet with initial HEP     Baseline  independent with stretching    Time  4    Period  Weeks    Status  On-going      PT SHORT TERM GOAL #3   Title  Patient will report pain no worse then 2/10 with sitting     Baseline  pain 5/10    Time  4    Period  Weeks    Status  On-going        PT Long Term Goals - 03/13/17 1610      PT LONG TERM GOAL #1   Title  Patient will stand for 1 hour without self report of pain in order to go shopping     Time  8    Period  Weeks    Status  On-going      PT LONG TERM GOAL #2   Title  Patient will bend to put her shoes on without pain     Time  8    Period  Weeks    Status  On-going      PT LONG TERM GOAL #3   Title  Patient will sit at work for 1 hour without increased  pain in order to perfrom work tasks     Time  8    Period  Weeks    Status  On-going      PT LONG TERM GOAL #4   Title  Patient will return to Yoga or bidy pump classess with a good understanding of how to protect her spine     Time  8    Period  Weeks    Status  On-going            Plan - 03/13/17 0915    Clinical Impression Statement  Patient is making progress. She is perfroming all exercises without difficulty. Therapy will give her quadruped and ball exericses next visit. She will also be given standing exercises. Anticipate D/C to HEP next visit.     Clinical Presentation  Stable    Clinical Decision Making  Low    Rehab Potential  Good    PT Frequency  2x / week    PT Duration  8 weeks    PT Treatment/Interventions  ADLs/Self Care Home Management;Cryotherapy;Electrical Stimulation;Ultrasound;Moist Heat;Iontophoresis 4mg /ml Dexamethasone;Gait training;Stair training;Therapeutic activities;Therapeutic exercise;Patient/family education;Neuromuscular re-education;Manual techniques;Passive range of motion;Dry needling;Taping;Splinting    PT Next Visit Plan  continue with soft tissue mobilization; review rexercises.     PT Home Exercise Plan  piriformis stretch; hasmtring stretch; prone on elbows;     Consulted and Agree with Plan of Care  Patient       Patient will benefit from skilled therapeutic intervention in order to improve the following deficits and impairments:  Pain, Increased muscle spasms, Decreased activity tolerance, Increased fascial restricitons, Postural dysfunction, Difficulty walking  Visit Diagnosis: Chronic bilateral low back pain without sciatica  Muscle spasm of back     Problem List Patient Active Problem List   Diagnosis Date Noted  . Benign thyroid cyst 01/11/2017  . Low back pain 01/11/2017  . Carotid atherosclerosis 01/11/2017  . Syncope 04/04/2016  .  Lipoma of neck 02/17/2016  . Atypical chest pain 02/17/2016  . Essential hypertension  01/20/2016  . Alcohol use 01/20/2016  . Heme positive stool   . GERD (gastroesophageal reflux disease) 12/10/2014    Carney Living PT DPT  03/13/2017, 9:18 AM  [facility] [address] [place], [place], [address] Phone: [phone]   Fax:  [phone]  Name: Leslie Noble MRN: [id number] Date of Birth: 07-19-1955

## 2017-03-14 ENCOUNTER — Other Ambulatory Visit: Payer: BLUE CROSS/BLUE SHIELD

## 2017-03-14 ENCOUNTER — Encounter: Payer: BLUE CROSS/BLUE SHIELD | Admitting: Physical Therapy

## 2017-03-15 ENCOUNTER — Ambulatory Visit: Payer: BLUE CROSS/BLUE SHIELD | Admitting: Physical Therapy

## 2017-03-15 ENCOUNTER — Ambulatory Visit: Payer: BLUE CROSS/BLUE SHIELD | Attending: Internal Medicine | Admitting: Physical Therapy

## 2017-03-15 ENCOUNTER — Other Ambulatory Visit: Payer: BLUE CROSS/BLUE SHIELD | Admitting: *Deleted

## 2017-03-15 DIAGNOSIS — M62830 Muscle spasm of back: Secondary | ICD-10-CM | POA: Diagnosis present

## 2017-03-15 DIAGNOSIS — M62838 Other muscle spasm: Secondary | ICD-10-CM | POA: Insufficient documentation

## 2017-03-15 DIAGNOSIS — G8929 Other chronic pain: Secondary | ICD-10-CM | POA: Insufficient documentation

## 2017-03-15 DIAGNOSIS — M25612 Stiffness of left shoulder, not elsewhere classified: Secondary | ICD-10-CM | POA: Diagnosis present

## 2017-03-15 DIAGNOSIS — M545 Low back pain: Secondary | ICD-10-CM | POA: Diagnosis present

## 2017-03-15 DIAGNOSIS — I10 Essential (primary) hypertension: Secondary | ICD-10-CM

## 2017-03-16 LAB — BASIC METABOLIC PANEL
BUN/Creatinine Ratio: 23 (ref 12–28)
BUN: 15 mg/dL (ref 8–27)
CO2: 25 mmol/L (ref 20–29)
Calcium: 9.4 mg/dL (ref 8.7–10.3)
Chloride: 99 mmol/L (ref 96–106)
Creatinine, Ser: 0.65 mg/dL (ref 0.57–1.00)
GFR calc Af Amer: 112 mL/min/{1.73_m2} (ref >59)
GFR calc non Af Amer: 97 mL/min/{1.73_m2} (ref >59)
Glucose: 132 mg/dL — ABNORMAL HIGH (ref 65–99)
Potassium: 3.8 mmol/L (ref 3.5–5.2)
Sodium: 141 mmol/L (ref 134–144)

## 2017-03-18 ENCOUNTER — Other Ambulatory Visit: Payer: Self-pay

## 2017-03-18 ENCOUNTER — Encounter: Payer: Self-pay | Admitting: Physical Therapy

## 2017-03-18 NOTE — Therapy (Signed)
[place], [place], [address] Phone: [phone]   Fax:  [phone]  Physical Therapy Evaluation  Patient Details  Name: Leslie Noble MRN: [id number] Date of Birth: [birth year] Referring Provider: Dr Jackolyn Confer    Encounter Date: 03/15/2017  PT End of Session - 03/18/17 0819    Visit Number  6    Number of Visits  16    Date for PT Re-Evaluation  04/15/17    Authorization Type  BC[employer]S     Authorization Time Period  2/13/ for lower back 3/4/ for shoulder     PT Start Time  1105    PT Stop Time  1151    PT Time Calculation (min)  46 min    Activity Tolerance  Patient tolerated treatment well    Behavior During Therapy  [facility] for tasks assessed/performed       Past Medical History:  Diagnosis Date  . Alcohol abuse   . High cholesterol   . Hypertension   . Sleep apnea    CPAP    Past Surgical History:  Procedure Laterality Date  . COLONOSCOPY WITH PROPOFOL N/A 12/31/2014   Procedure: COLONOSCOPY WITH PROPOFOL;  Surgeon: Mauri Pole, MD;  Location: [facility];  Service: Endoscopy;  Laterality: N/A;  . ESOPHAGOGASTRODUODENOSCOPY (EGD) WITH PROPOFOL N/A 12/31/2014   Procedure: ESOPHAGOGASTRODUODENOSCOPY (EGD) WITH PROPOFOL;  Surgeon: Mauri Pole, MD;  Location: [facility];  Service: Endoscopy;  Laterality: N/A;  . KNEE ARTHROSCOPY Left   . LIPOMA EXCISION Left 12/31/2016   neck    There were no vitals filed for this visit.   Subjective Assessment - 03/18/17 0814    Subjective  The patient comes in today with prescription to work on her left shoulder and neck pain. She had a lipoma removal on 12/31/2016. Since that point she has had pain, neck stiffness and limitations of her shoulder use.     Limitations  Standing;Walking    How long can you sit comfortably?  < 15 minutes before she becomes stiff     How long can you stand comfortably?  < 30 min     How long can you walk  comfortably?  Back hurts less when she is walking     Patient Stated Goals  having difficulty with active lifestyle     Currently in Pain?  Yes    Pain Score  3     Pain Location  Shoulder    Pain Orientation  Left    Pain Descriptors / Indicators  Aching    Pain Type  Chronic pain    Pain Onset  More than a month ago    Pain Frequency  Intermittent    Aggravating Factors   long days at work     Pain Relieving Factors  walking     Effect of Pain on Daily Activities  not back to workouts yet          [facility] PT Assessment - 03/18/17 0001      Assessment   Medical Diagnosis  Upper trap and pec spasm     Referring Provider  Dr Jackolyn Confer     Hand Dominance  Right    Next MD Visit  None scheduled     Prior Therapy  none scheduled       Precautions   Precautions  None      Restrictions   Weight Bearing Restrictions  No      Balance  Screen   Has the patient fallen in the past 6 months  No    Has the patient had a decrease in activity level because of a fear of falling?   No    Is the patient reluctant to leave their home because of a fear of falling?   No      Home Film/video editor residence      Prior Function   Level of Independence  Independent    Vocation  Full time employment    Vocation Requirements  works as Geologist, engineering; Sits for long periods of time     Leisure  yoga and body pump classes       Cognition   Overall Cognitive Status  Within Functional Limits for tasks assessed    Attention  Focused    Focused Attention  Appears intact    Memory  Appears intact    Awareness  Appears intact    Problem Solving  Appears intact      Sensation   Additional Comments  pain radiate into the left upper chest and into the anterior shoulder       Coordination   Gross Motor Movements are Fluid and Coordinated  Yes    Fine Motor Movements are Fluid and Coordinated  Yes      ROM / Strength   AROM / PROM / Strength  Strength      AROM   AROM  Assessment Site  Lumbar;Cervical    Cervical Flexion  35     Cervical Extension  28    Cervical - Right Rotation  60    Cervical - Left Rotation  52    Lumbar Flexion  limited 50% with pain stiffness noted     Lumbar Extension  limited 50%     Lumbar - Right Side Bend  pain     Lumbar - Left Side Bend  no pain     Lumbar - Right Rotation  pain     Lumbar - Left Rotation  no pain       PROM   Overall PROM Comments  full passive rang of motion with minor tightness at end range abduction     PROM Assessment Site  Shoulder    Right/Left Shoulder  Right;Left      Strength   Strength Assessment Site  Shoulder    Right/Left Shoulder  Left    Left Shoulder Flexion  4/5    Left Shoulder ABduction  4/5    Left Shoulder Internal Rotation  4+/5    Left Shoulder External Rotation  4+/5      Palpation   Palpation comment  spasming in upper trap; tghtness around her collar bone              Objective measurements completed on examination: See above findings.      [facility] Adult PT Treatment/Exercise - 03/18/17 0001      Neck Exercises: Stretches   Upper Trapezius Stretch Limitations  3x20 sec hold     Levator Stretch Limitations  3x20 sec hold     Corner Stretch Limitations  door way pec stretch 3x20 sec hold     Other Neck Stretches  reviewed self soft tissue mobilization with shepards hook.                PT Short Term Goals - 03/18/17 0831      PT SHORT TERM GOAL #1   Title  Patient  will demsotrate a good core contraction     Baseline  still fair     Time  4    Period  Weeks    Status  On-going      PT SHORT TERM GOAL #2   Title  Patient will be independnet with initial HEP     Baseline  independent with stretching    Time  4    Period  Weeks    Status  On-going      PT SHORT TERM GOAL #3   Title  Patient will report pain no worse then 2/10 with sitting     Baseline  pain 5/10    Time  4    Period  Weeks    Status  On-going      PT SHORT TERM GOAL #4    Title  Patient will demonstrate full left active motion of her shoulder     Time  4    Period  Weeks    Status  New    Target Date  04/15/17      PT SHORT TERM GOAL #5   Title  Patient will report decreased tenderness to palpaption of her left upper trap.     Time  4    Period  Weeks    Status  New    Target Date  04/15/17        PT Long Term Goals - 03/18/17 2355      PT LONG TERM GOAL #1   Title  Patient will stand for 1 hour without self report of pain in order to go shopping     Time  8    Period  Weeks      PT LONG TERM GOAL #2   Title  Patient will bend to put her shoes on without pain     Time  8    Period  Weeks    Status  On-going      PT LONG TERM GOAL #3   Title  Patient will sit at work for 1 hour without increased pain in order to perfrom work tasks     Time  8    Period  Weeks    Status  On-going      PT LONG TERM GOAL #4   Title  Patient will return to Yoga or bidy pump classess with a good understanding of how to protect her spine     Time  8    Period  Weeks    Status  On-going      PT LONG TERM GOAL #5   Title  Patient will reach overhead with her left arm to grab a 2lb weight     Time  8    Period  Weeks    Status  New             Plan - 03/18/17 0825    Clinical Impression Statement  Patient presents today with pain in her left shoulder and left upper trap. She has limitation in shoulder strength and motion as well as cervical motion. She would benefit from skilled therapy to reduce spasming and improve functional use of her left shoulder. She is also being seen for her back. Therapy will see her for both issues.     History and Personal Factors relevant to plan of care:  HTN     Clinical Presentation  Stable    Clinical Decision Making  Low    Rehab Potential  Good    PT Frequency  2x / week    PT Duration  8 weeks    PT Treatment/Interventions  ADLs/Self Care Home Management;Cryotherapy;Electrical Stimulation;Ultrasound;Moist  Heat;Iontophoresis 4mg /ml Dexamethasone;Gait training;Stair training;Therapeutic activities;Therapeutic exercise;Patient/family education;Neuromuscular re-education;Manual techniques;Passive range of motion;Dry needling;Taping;Splinting    PT Next Visit Plan  SCM stretch; scaline stretch; review HEP; Soft tissue mobilization; Potential TPDN     PT Home Exercise Plan  piriformis stretch; hasmtring stretch; prone on elbows;     Consulted and Agree with Plan of Care  Patient       Patient will benefit from skilled therapeutic intervention in order to improve the following deficits and impairments:  Pain, Increased muscle spasms, Decreased activity tolerance, Increased fascial restricitons, Postural dysfunction, Difficulty walking  Visit Diagnosis: Stiffness of left shoulder, not elsewhere classified - Plan: PT plan of care cert/re-cert  Chronic bilateral low back pain without sciatica - Plan: PT plan of care cert/re-cert  Muscle spasm of back - Plan: PT plan of care cert/re-cert  Other muscle spasm - Plan: PT plan of care cert/re-cert     Problem List Patient Active Problem List   Diagnosis Date Noted  . Benign thyroid cyst 01/11/2017  . Low back pain 01/11/2017  . Carotid atherosclerosis 01/11/2017  . Syncope 04/04/2016  . Lipoma of neck 02/17/2016  . Atypical chest pain 02/17/2016  . Essential hypertension 01/20/2016  . Alcohol use 01/20/2016  . Heme positive stool   . GERD (gastroesophageal reflux disease) 12/10/2014    Carney Living PT DPT  03/18/2017, 9:24 AM  [facility] [address] [place], [place], [address] Phone: [phone]   Fax:  [phone]  Name: Leslie Noble MRN: [id number] Date of Birth: [birth year]

## 2017-03-19 ENCOUNTER — Ambulatory Visit: Payer: BLUE CROSS/BLUE SHIELD | Admitting: Physical Therapy

## 2017-03-19 ENCOUNTER — Encounter: Payer: Self-pay | Admitting: Physical Therapy

## 2017-03-19 DIAGNOSIS — M62830 Muscle spasm of back: Secondary | ICD-10-CM

## 2017-03-19 DIAGNOSIS — M25612 Stiffness of left shoulder, not elsewhere classified: Secondary | ICD-10-CM | POA: Diagnosis not present

## 2017-03-19 DIAGNOSIS — G8929 Other chronic pain: Secondary | ICD-10-CM

## 2017-03-19 DIAGNOSIS — M545 Low back pain, unspecified: Secondary | ICD-10-CM

## 2017-03-19 DIAGNOSIS — M62838 Other muscle spasm: Secondary | ICD-10-CM

## 2017-03-19 NOTE — Patient Instructions (Signed)
  Bridge [employer] small of back into mat, maintain pelvic tilt, roll up one vertebrae at a time. Focus on engaging posterior hip muscles. Hold for ____ breaths. Repeat ____ times.  Copyright  VHI. All rights reserved.   Abductor Strength: Bridge Pose (Strap)   Make strap wide enough to brace knees at hip width. Press into strap with knees. Hold for ____ breaths. Repeat ____ times.  Copyright  VHI. All rights reserved.              Bridging: with Straight Leg Raise   With legs bent, lift buttocks ____ inches from floor. Then slowly extend right knee, keeping stomach tight. Repeat ____ times per set. Do ____ sets per session. Do ____ sessions per day.  http://orth.exer.us/1104   Copyright  VHI. All rights reserved.       Bridge Pose, One Leg   Bring knee to chest. Roll up from tailbone to bridge pose on supporting leg. Focus on engaging posterior hip muscles. Hold for ____ breaths. Repeat ____ times each leg.  Copyright  VHI. All rights reser      Over Head Pull: Narrow Grip       On back, knees bent, feet flat, band across thighs, elbows straight but relaxed. Pull hands apart (start). Keeping elbows straight, bring arms up and over head, hands toward floor. Keep pull steady on band. Hold momentarily. Return slowly, keeping pull steady, back to start. Repeat _10 to 30__ times. Band color RED______   Side Pull: Double Arm   On back, knees bent, feet flat. Arms perpendicular to body, shoulder level, elbows straight but relaxed. Pull arms out to sides, elbows straight. Resistance band comes across collarbones, hands toward floor. Hold momentarily. Slowly return to starting position. Repeat _10 to 30 __ times. Band color __RED___   Sash   On back, knees bent, feet flat, left hand on left hip, right hand above left. Pull right arm DIAGONALLY (hip to shoulder) across chest. Bring right arm along head toward floor. Hold momentarily. Slowly return to  starting position. Repeat __1- to 30_ times. Do with left arm. Band color __RED____   Shoulder Rotation: Double Arm   On back, knees bent, feet flat, elbows tucked at sides, bent 90, hands palms up. Pull hands apart and down toward floor, keeping elbows near sides. Hold momentarily. Slowly return to starting position. Repeat __10 to 30_ times. Band color __red____

## 2017-03-19 NOTE — Therapy (Signed)
[place], [place], [address] Phone: [phone]   Fax:  [phone]  Physical Therapy Treatment  Patient Details  Name: Leslie Noble MRN: [id number] Date of Birth: 31-Mar-1955 Referring Provider: Dr Jackolyn Confer    Encounter Date: 03/19/2017  PT End of Session - 03/19/17 1703    Visit Number  7    Number of Visits  16    Date for PT Re-Evaluation  04/15/17    PT Start Time  6606    PT Stop Time  1628    PT Time Calculation (min)  41 min    Activity Tolerance  Patient tolerated treatment well    Behavior During Therapy  [facility] for tasks assessed/performed       Past Medical History:  Diagnosis Date  . Alcohol abuse   . High cholesterol   . Hypertension   . Sleep apnea    CPAP    Past Surgical History:  Procedure Laterality Date  . COLONOSCOPY WITH PROPOFOL N/A 12/31/2014   Procedure: COLONOSCOPY WITH PROPOFOL;  Surgeon: Mauri Pole, MD;  Location: [facility];  Service: Endoscopy;  Laterality: N/A;  . ESOPHAGOGASTRODUODENOSCOPY (EGD) WITH PROPOFOL N/A 12/31/2014   Procedure: ESOPHAGOGASTRODUODENOSCOPY (EGD) WITH PROPOFOL;  Surgeon: Mauri Pole, MD;  Location: [facility];  Service: Endoscopy;  Laterality: N/A;  . KNEE ARTHROSCOPY Left   . LIPOMA EXCISION Left 12/31/2016   neck    There were no vitals filed for this visit.  Subjective Assessment - 03/19/17 1559    Subjective  Wants neck today/ shoulder.      Currently in Pain?  Yes    Pain Score  3     Pain Location  Shoulder    Pain Orientation  Left    Pain Descriptors / Indicators  Dull    Pain Onset  More than a month ago    Pain Frequency  Intermittent    Aggravating Factors   YOGA poses.      Pain Relieving Factors  walkinf    Effect of Pain on Daily Activities  Not bck to workouts yet    Multiple Pain Sites  -- Back 2-3/10,  worse walking                      [facility] Adult PT Treatment/Exercise -  03/19/17 0001      Lumbar Exercises: Supine   Other Supine Lumbar Exercises  Supine scapular stabilization series,  all practiced with red band and cues 10 + each reps,  HEP.,  see Instructions      Shoulder Exercises: Standing   Horizontal ABduction  10 reps    Theraband Level (Shoulder Horizontal ABduction)  Level 1 (Yellow) HEP    External Rotation  10 reps no band  HEP,  cued    Extension  10 reps    Theraband Level (Shoulder Extension)  Level 1 (Yellow) HEP    Other Standing Exercises  Diagonal , left above head 10 X, yellow band 10 X  HEP      Neck Exercises: Stretches   Upper Trapezius Stretch Limitations  1 X 10 needed cues to hold 20-30 seconds.      Levator Stretch Limitations  1 X 10    Other Neck Stretches  Scalene stretch 1 X 10 seconds,  cued for gentle.             PT Education - 03/19/17 1612    Education provided  Yes    Education Details  HEP    Person(s) Educated  Patient    Methods  Explanation;Demonstration;Tactile cues;Verbal cues;Handout    Comprehension  Returned demonstration;Verbalized understanding       PT Short Term Goals - 03/18/17 0831      PT SHORT TERM GOAL #1   Title  Patient will demsotrate a good core contraction     Baseline  still fair     Time  4    Period  Weeks    Status  On-going      PT SHORT TERM GOAL #2   Title  Patient will be independnet with initial HEP     Baseline  independent with stretching    Time  4    Period  Weeks    Status  On-going      PT SHORT TERM GOAL #3   Title  Patient will report pain no worse then 2/10 with sitting     Baseline  pain 5/10    Time  4    Period  Weeks    Status  On-going      PT SHORT TERM GOAL #4   Title  Patient will demonstrate full left active motion of her shoulder     Time  4    Period  Weeks    Status  New    Target Date  04/15/17      PT SHORT TERM GOAL #5   Title  Patient will report decreased tenderness to palpaption of her left upper trap.     Time  4     Period  Weeks    Status  New    Target Date  04/15/17        PT Long Term Goals - 03/18/17 7253      PT LONG TERM GOAL #1   Title  Patient will stand for 1 hour without self report of pain in order to go shopping     Time  8    Period  Weeks      PT LONG TERM GOAL #2   Title  Patient will bend to put her shoes on without pain     Time  8    Period  Weeks    Status  On-going      PT LONG TERM GOAL #3   Title  Patient will sit at work for 1 hour without increased pain in order to perfrom work tasks     Time  8    Period  Weeks    Status  On-going      PT LONG TERM GOAL #4   Title  Patient will return to Yoga or bidy pump classess with a good understanding of how to protect her spine     Time  8    Period  Weeks    Status  On-going      PT LONG TERM GOAL #5   Title  Patient will reach overhead with her left arm to grab a 2lb weight     Time  8    Period  Weeks    Status  New            Plan - 03/19/17 1704    Clinical Impression Statement  Progress toward her HEP goals.  Mild pain in left arm with some exercises.  When cued to decrease range to decreas discomfort, patient chose to work through the pain.  No new objective findings since yesterday.  PT Next Visit Plan  SCM stretch; scaline stretch; review HEP; Soft tissue mobilization; Potential TPDN review HEP    PT Home Exercise Plan  piriformis stretch; hasmtring stretch; prone on elbows; supine scapular stabilization and scapular band unattached.      Consulted and Agree with Plan of Care  Patient       Patient will benefit from skilled therapeutic intervention in order to improve the following deficits and impairments:     Visit Diagnosis: Chronic bilateral low back pain without sciatica  Muscle spasm of back  Stiffness of left shoulder, not elsewhere classified  Other muscle spasm     Problem List Patient Active Problem List   Diagnosis Date Noted  . Benign thyroid cyst 01/11/2017  . Low back  pain 01/11/2017  . Carotid atherosclerosis 01/11/2017  . Syncope 04/04/2016  . Lipoma of neck 02/17/2016  . Atypical chest pain 02/17/2016  . Essential hypertension 01/20/2016  . Alcohol use 01/20/2016  . Heme positive stool   . GERD (gastroesophageal reflux disease) 12/10/2014    HARRIS,Oneta PTA 03/19/2017, 5:39 PM  [facility] [address] [place], [place], [address] Phone: [phone]   Fax:  [phone]  Name: Leslie Noble MRN: [id number] Date of Birth: Aug 09, 1955

## 2017-03-21 ENCOUNTER — Other Ambulatory Visit: Payer: Self-pay | Admitting: Internal Medicine

## 2017-03-21 ENCOUNTER — Ambulatory Visit: Payer: BLUE CROSS/BLUE SHIELD | Admitting: Physical Therapy

## 2017-03-21 DIAGNOSIS — M62830 Muscle spasm of back: Secondary | ICD-10-CM

## 2017-03-21 DIAGNOSIS — M25612 Stiffness of left shoulder, not elsewhere classified: Secondary | ICD-10-CM | POA: Diagnosis not present

## 2017-03-21 DIAGNOSIS — M62838 Other muscle spasm: Secondary | ICD-10-CM

## 2017-03-21 DIAGNOSIS — M545 Low back pain: Principal | ICD-10-CM

## 2017-03-21 DIAGNOSIS — G8929 Other chronic pain: Secondary | ICD-10-CM

## 2017-03-22 ENCOUNTER — Encounter: Payer: Self-pay | Admitting: Physical Therapy

## 2017-03-22 NOTE — Therapy (Addendum)
[place], [place], [address] Phone: [phone]   Fax:  [phone]  Physical Therapy Treatment/ Discharge   Patient Details  Name: Leslie Noble MRN: [id number] Date of Birth: June 10, 1955 Referring Provider: Dr Jackolyn Confer    Encounter Date: 03/21/2017  PT End of Session - 03/21/17 1717    Visit Number  8    Number of Visits  16    Date for PT Re-Evaluation  04/15/17    Authorization Type  BCBCS     Authorization Time Period  2/13/ for lower back 3/4/ for shoulder     PT Start Time  1545    PT Stop Time  1627    PT Time Calculation (min)  42 min    Activity Tolerance  Patient tolerated treatment well    Behavior During Therapy  [facility] for tasks assessed/performed       Past Medical History:  Diagnosis Date  . Alcohol abuse   . High cholesterol   . Hypertension   . Sleep apnea    CPAP    Past Surgical History:  Procedure Laterality Date  . COLONOSCOPY WITH PROPOFOL N/A 12/31/2014   Procedure: COLONOSCOPY WITH PROPOFOL;  Surgeon: Mauri Pole, MD;  Location: [facility];  Service: Endoscopy;  Laterality: N/A;  . ESOPHAGOGASTRODUODENOSCOPY (EGD) WITH PROPOFOL N/A 12/31/2014   Procedure: ESOPHAGOGASTRODUODENOSCOPY (EGD) WITH PROPOFOL;  Surgeon: Mauri Pole, MD;  Location: [facility];  Service: Endoscopy;  Laterality: N/A;  . KNEE ARTHROSCOPY Left   . LIPOMA EXCISION Left 12/31/2016   neck    There were no vitals filed for this visit.  Subjective Assessment - 03/22/17 0747    Subjective  Patient reports her neck and shoulder have been much better. she had full abduction ROM. She has pain in the center of her back but that is much better.     Limitations  Standing;Walking    How long can you sit comfortably?  < 15 minutes before she becomes stiff     How long can you stand comfortably?  < 30 min     How long can you walk comfortably?  Back hurts less when she is walking     Patient Stated Goals  having difficulty with active lifestyle     Currently in Pain?  Yes    Pain Score  2     Pain Location  Back    Pain Orientation  Left    Pain Descriptors / Indicators  Aching    Pain Type  Chronic pain    Pain Onset  More than a month ago    Pain Frequency  Intermittent    Aggravating Factors   YOGA     Pain Relieving Factors  walking     Effect of Pain on Daily Activities  Not back to work                       [facility] Adult PT Treatment/Exercise - 03/22/17 0001      Lumbar Exercises: Stretches   Passive Hamstring Stretch Limitations  hamstring stretch with strap 3x20 sec     Piriformis Stretch  2 reps;20 seconds good motion      Lumbar Exercises: Supine   Bent Knee Raise Limitations  2x10 with cuing for breathing     Bridge Limitations  2x10    Other Supine Lumbar Exercises  Supine scapular stabilization series,  all practiced with red band and cues 10 +  each reps,  HEP.,  see Instructions      Manual Therapy   Joint Mobilization  LAD 2x10 bilateral     Soft tissue mobilization  IASTYM to upper traps and lower lumbar spine    Myofascial Release  to bilateral lumbar spine       Neck Exercises: Stretches   Upper Trapezius Stretch Limitations  3x20 sec hold     Levator Stretch Limitations  3x20 sec hold              PT Education - 03/22/17 0750    Education provided  Yes    Education Details  reviewed total HEP and soft tissue work     [employer]) Educated  Patient    Methods  Explanation;Demonstration;Tactile cues;Verbal cues    Comprehension  Verbalized understanding;Returned demonstration;Verbal cues required;Tactile cues required       PT Short Term Goals - 03/18/17 0831      PT SHORT TERM GOAL #1   Title  Patient will demsotrate a good core contraction     Baseline  still fair     Time  4    Period  Weeks    Status  On-going      PT SHORT TERM GOAL #2   Title  Patient will be independnet with initial HEP     Baseline   independent with stretching    Time  4    Period  Weeks    Status  On-going      PT SHORT TERM GOAL #3   Title  Patient will report pain no worse then 2/10 with sitting     Baseline  pain 5/10    Time  4    Period  Weeks    Status  On-going      PT SHORT TERM GOAL #4   Title  Patient will demonstrate full left active motion of her shoulder     Time  4    Period  Weeks    Status  New    Target Date  04/15/17      PT SHORT TERM GOAL #5   Title  Patient will report decreased tenderness to palpaption of her left upper trap.     Time  4    Period  Weeks    Status  New    Target Date  04/15/17        PT Long Term Goals - 03/18/17 5188      PT LONG TERM GOAL #1   Title  Patient will stand for 1 hour without self report of pain in order to go shopping     Time  8    Period  Weeks      PT LONG TERM GOAL #2   Title  Patient will bend to put her shoes on without pain     Time  8    Period  Weeks    Status  On-going      PT LONG TERM GOAL #3   Title  Patient will sit at work for 1 hour without increased pain in order to perfrom work tasks     Time  8    Period  Weeks    Status  On-going      PT LONG TERM GOAL #4   Title  Patient will return to Yoga or bidy pump classess with a good understanding of how to protect her spine     Time  8    Period  Weeks    Status  On-going      PT LONG TERM GOAL #5   Title  Patient will reach overhead with her left arm to grab a 2lb weight     Time  8    Period  Weeks    Status  New            Plan - 03/22/17 0751    Clinical Impression Statement  Patient will go home and work on her HEP for a few weeks. She will come in for a fowllo up as needed. She hasmet most goals although she is still having some centralized low back pain. She had just a 28% limitation on FOTO today.     Clinical Presentation  Stable    Clinical Decision Making  Low    PT Treatment/Interventions  ADLs/Self Care Home Management;Cryotherapy;Electrical  Stimulation;Ultrasound;Moist Heat;Iontophoresis 50m/ml Dexamethasone;Gait training;Stair training;Therapeutic activities;Therapeutic exercise;Patient/family education;Neuromuscular re-education;Manual techniques;Passive range of motion;Dry needling;Taping;Splinting    PT Next Visit Plan  SCM stretch; scaline stretch; review HEP; Soft tissue mobilization; Potential TPDN review HEP    PT Home Exercise Plan  piriformis stretch; hasmtring stretch; prone on elbows; supine scapular stabilization and scapular band unattached.      Consulted and Agree with Plan of Care  Patient       Patient will benefit from skilled therapeutic intervention in order to improve the following deficits and impairments:     Visit Diagnosis: Chronic bilateral low back pain without sciatica  Muscle spasm of back  Stiffness of left shoulder, not elsewhere classified  Other muscle spasm  PHYSICAL THERAPY DISCHARGE SUMMARY  Visits from Start of Care: 8  Current functional level related to goals / functional outcomes: Improved function with neck and back    Remaining deficits: Pain at times in her back    Education / Equipment: HEP  Plan: Patient agrees to discharge.  Patient goals were not met. Patient is being discharged due to meeting the stated rehab goals.  ?????       Problem List Patient Active Problem List   Diagnosis Date Noted  . Benign thyroid cyst 01/11/2017  . Low back pain 01/11/2017  . Carotid atherosclerosis 01/11/2017  . Syncope 04/04/2016  . Lipoma of neck 02/17/2016  . Atypical chest pain 02/17/2016  . Essential hypertension 01/20/2016  . Alcohol use 01/20/2016  . Heme positive stool   . GERD (gastroesophageal reflux disease) 12/10/2014    DCarney LivingPT DPT  03/22/2017, 7:56 AM  [facility] [address]Phone: [phone]  Fax:  [phone] Name: Leslie ManardMRN: [id number] of  Birth: 222-Jun-1957

## 2017-04-02 ENCOUNTER — Other Ambulatory Visit: Payer: Self-pay | Admitting: Internal Medicine

## 2017-04-05 ENCOUNTER — Ambulatory Visit (HOSPITAL_COMMUNITY)
Admission: RE | Admit: 2017-04-05 | Discharge: 2017-04-05 | Disposition: A | Discharge status: Home / Self Care | Payer: BLUE CROSS/BLUE SHIELD | Source: Ambulatory Visit | Attending: Nurse Practitioner | Admitting: Nurse Practitioner

## 2017-04-05 ENCOUNTER — Encounter (HOSPITAL_COMMUNITY): Payer: Self-pay

## 2017-04-05 ENCOUNTER — Ambulatory Visit (HOSPITAL_COMMUNITY)
Admission: RE | Admit: 2017-04-05 | Discharge: 2017-04-05 | DRG: 313 | Disposition: A | Discharge status: Home / Self Care | Payer: BLUE CROSS/BLUE SHIELD | Source: Ambulatory Visit | Attending: Nurse Practitioner | Admitting: Nurse Practitioner

## 2017-04-05 DIAGNOSIS — R9431 Abnormal electrocardiogram [ECG] [EKG]: Secondary | ICD-10-CM

## 2017-04-05 DIAGNOSIS — R079 Chest pain, unspecified: Secondary | ICD-10-CM | POA: Diagnosis not present

## 2017-04-05 DIAGNOSIS — I10 Essential (primary) hypertension: Secondary | ICD-10-CM

## 2017-04-05 MED ORDER — IOPAMIDOL (ISOVUE-370) INJECTION 76%
INTRAVENOUS | Status: AC
  Administered 2017-04-05: 100 mL
  Filled 2017-04-05: qty 100

## 2017-04-05 MED ORDER — NITROGLYCERIN 0.4 MG SL SUBL
SUBLINGUAL_TABLET | SUBLINGUAL | Status: AC
  Administered 2017-04-05: 0.8 mg via SUBLINGUAL
  Filled 2017-04-05: qty 2

## 2017-04-05 MED ORDER — NITROGLYCERIN 0.4 MG SL SUBL
0.8000 mg | SUBLINGUAL_TABLET | Freq: Once | SUBLINGUAL | Status: AC
  Administered 2017-04-05: 0.8 mg via SUBLINGUAL
  Filled 2017-04-05: qty 25

## 2017-04-05 NOTE — Progress Notes (Signed)
Patient discharged ambulatory following CT heart with husband.

## 2017-04-08 ENCOUNTER — Ambulatory Visit (INDEPENDENT_AMBULATORY_CARE_PROVIDER_SITE_OTHER): Payer: BLUE CROSS/BLUE SHIELD | Admitting: Nurse Practitioner

## 2017-04-08 ENCOUNTER — Encounter: Payer: Self-pay | Admitting: Nurse Practitioner

## 2017-04-08 VITALS — BP 130/90 | HR 77 | Ht 67.0 in | Wt 179.4 lb

## 2017-04-08 DIAGNOSIS — R079 Chest pain, unspecified: Secondary | ICD-10-CM

## 2017-04-08 DIAGNOSIS — I10 Essential (primary) hypertension: Secondary | ICD-10-CM

## 2017-04-08 DIAGNOSIS — E7849 Other hyperlipidemia: Secondary | ICD-10-CM | POA: Diagnosis not present

## 2017-04-08 NOTE — Progress Notes (Signed)
CARDIOLOGY OFFICE NOTE  Date:  04/08/2017    Arpita Fentress Date of Birth: 09-08-1955 Medical Record #[id number]  PCP:  Hoyt Koch, MD  Cardiologist:  Aldona Bar  Chief Complaint  Patient presents with  . Hyperlipidemia  . Chest Pain    Follow up visit after cardiac CT - seen for Dr. Angelena Form    History of Present Illness: Leslie Noble is a 62 y.o. female who presents today for a follow up visit. Seen for Dr. Angelena Form - seen as a new patient as DOD in January of 2018.   She has a history of HTN, HLD, OSA - on CPAP.  I saw her in January of 2018 - was having atypical chest pain. BP was up. We got a GXT and updated her echo. This turned out ok.   I saw her last month - lots of issues - worried about her lipids. Had had a CT on her neck for a lipoma - this showed minimal atherosclerotic changes. She has not wanted to take statin. She has had chronic chest pain - chronic abnormal EKG. Chronic GERD. She has alcohol use. We opted to get a cardiac CT - see below and then come back for discussion.   Comes back today. Here alone. Remains quite anxious. She feels good. Exercising more. Doing yoga. Less chest pain. Watching her carbs and trying to "do better".    Past Medical History:  Diagnosis Date  . Alcohol abuse   . High cholesterol   . Hypertension   . Sleep apnea    CPAP    Past Surgical History:  Procedure Laterality Date  . COLONOSCOPY WITH PROPOFOL N/A 12/31/2014   Procedure: COLONOSCOPY WITH PROPOFOL;  Surgeon: Mauri Pole, MD;  [facility];  Service: Endoscopy;  Laterality: N/A;  . ESOPHAGOGASTRODUODENOSCOPY (EGD) WITH PROPOFOL N/A 12/31/2014   Procedure: ESOPHAGOGASTRODUODENOSCOPY (EGD) WITH PROPOFOL;  Surgeon: Mauri Pole, MD;  Location: [facility];  Service: Endoscopy;  Laterality: N/A;  . KNEE ARTHROSCOPY Left   . LIPOMA EXCISION Left 12/31/2016   neck     Medications: Current Meds  Medication Sig  .  ALPRAZolam (XANAX) 0.5 MG tablet Take 1 tablet (0.5 mg total) by mouth at bedtime as needed for anxiety.  Marland Kitchen amLODipine (NORVASC) 10 MG tablet Take 1 tablet (10 mg total) by mouth daily. Need annual visit for further refills  . CALCIUM CARBONATE-VIT D-MIN PO Take 600 mg by mouth 2 (two) times daily.   . famotidine-calcium carbonate-magnesium hydroxide (PEPCID COMPLETE) 10-800-165 MG chewable tablet Chew 1 tablet by mouth daily as needed.  . Multiple Vitamins-Minerals (CENTRUM SILVER ULTRA WOMENS PO) Take 1 tablet by mouth daily.  . quinapril-hydrochlorothiazide (ACCURETIC) 20-12.5 MG tablet Take 2 tablets by mouth daily. ----Needs annual visit with dr crawford for further refills  . zolpidem (AMBIEN) 10 MG tablet Take 10 mg by mouth at bedtime as needed for sleep.      Allergies: No Known Allergies  Social History: The patient  reports that she quit smoking about 35 years ago. Her smoking use included cigarettes. she has never used smokeless tobacco. She reports that she drinks alcohol. She reports that she does not use drugs.   Family History: The patient's family history includes Alcoholism in her maternal grandfather; Barrett's esophagus in her father; Breast cancer in her mother; Cancer in her paternal grandmother; Drug abuse in her brother; GER disease in her father; Heart disease in her mother; Hypertension in her father; Sleep  apnea in her father.   Review of Systems: Please see the history of present illness.   Otherwise, the review of systems is positive for none.   All other systems are reviewed and negative.   Physical Exam: VS:  BP 130/90 (BP Location: Left Arm, Patient Position: Sitting, Cuff Size: Normal)   Pulse 77   Ht 5\' 7"  (1.702 m)   Wt 179 lb 6.4 oz (81.4 kg)   SpO2 100% Comment: at rest  BMI 28.10 kg/m  .  BMI Body mass index is 28.1 kg/m.  Wt Readings from Last 3 Encounters:  04/08/17 179 lb 6.4 oz (81.4 kg)  03/11/17 179 lb 12.8 oz (81.6 kg)  01/11/17 179 lb  (81.2 kg)    General: Pleasant. Well developed, well nourished and in no acute distress.   HEENT: Normal.  Neck: Supple, no JVD, carotid bruits, or masses noted.  Cardiac: Regular rate and rhythm. No murmurs, rubs, or gallops. No edema.  Respiratory:  Lungs are clear to auscultation bilaterally with normal work of breathing.  GI: Soft and nontender.  MS: No deformity or atrophy. Gait and ROM intact.  Skin: Warm and dry. Color is normal.  Neuro:  Strength and sensation are intact and no gross focal deficits noted.  Psych: Alert, appropriate and with normal affect.   LABORATORY DATA:  EKG:  EKG is not ordered today.  Lab Results  Component Value Date   WBC 5.8 03/07/2016   HGB 12.6 03/07/2016   HCT 37.3 03/07/2016   PLT 292.0 03/07/2016   GLUCOSE 132 (H) 03/15/2017   CHOL 304 (H) 01/11/2017   TRIG 175.0 (H) 01/11/2017   HDL 49.90 01/11/2017   LDLDIRECT 141.1 03/05/2006   LDLCALC 219 (H) 01/11/2017   ALT 18 03/07/2016   AST 15 03/07/2016   NA 141 03/15/2017   K 3.8 03/15/2017   CL 99 03/15/2017   CREATININE 0.65 03/15/2017   BUN 15 03/15/2017   CO2 25 03/15/2017   TSH 1.85 01/11/2017   HGBA1C 5.7 03/07/2016     BNP (last 3 results) No results for input(s): BNP in the last 8760 hours.  ProBNP (last 3 results) No results for input(s): PROBNP in the last 8760 hours.   Other Studies Reviewed Today:  Cardiac CT 03/2017 FINDINGS: Small left Bochdalek's hernia. Within the visualized portions of the thorax there are no suspicious appearing pulmonary nodules or masses, there is no acute consolidative airspace disease, no pleural effusions, no pneumothorax and no lymphadenopathy. Visualized portions of the upper abdomen are unremarkable. There are no aggressive appearing lytic or blastic lesions noted in the visualized portions of the skeleton.  IMPRESSION: No significant incidental noncardiac findings are noted  IMPRESSION: 1. Coronary artery calcium score 0  Agatston units, suggesting low risk for future cardiac events.  2.  No plaque or stenosis noted in the coronary arteries.  Dalton Mclean   GXT Study Highlights 04/2016    Blood pressure demonstrated a hypertensive response to exercise.  No T wave inversion was noted during stress.  There was no ST segment deviation noted during stress.  Overall, the patient's exercise capacity was normal  D[facility] Treadmill Score: low risk  Negative stress test without evidence of ischemia at given workload.    Echo Study Conclusions 03/2016  - Left ventricle: The cavity size was normal. Wall thickness was increased in a pattern of mild LVH. Systolic function was normal. The estimated ejection fraction was in the range of 60% to 65%. Wall motion was  normal; there were no regional wall motion abnormalities. Doppler parameters are consistent with abnormal left ventricular relaxation (grade 1 diastolic dysfunction). Doppler parameters are consistent with indeterminate ventricular filling pressure. - Mitral valve: Transvalvular velocity was within the normal range. There was no evidence for stenosis. There was trivial regurgitation. - Right ventricle: The cavity size was normal. Wall thickness was normal. Systolic function was normal. - Atrial septum: No defect or patent foramen ovale was identified by color flow Doppler. - Tricuspid valve: There was mild regurgitation. - Pulmonary arteries: Systolic pressure was within the normal range. PA peak pressure: 26 mm Hg (S).   Assessment/Plan:  1. Marked HLD - she has been very hesitant about taking statin therapy - worried about liver involvement - and with her other issues (that she has not wanted in her record) rightfully so. While her calcium scoring of 0 and no current evidence of CAD is reassuring - still need to consider long term prognosis and general health. She is agreeable to continue to work on lifestyle  recommendations.   2. Chest pain- reassuring cardiac CT - does have a hernia - would defer to her PCP.  3. HTN- BP has improved here today - no changes made.   4. Obesity- she seems to have better insight - reading labels, exercising.   Current medicines are reviewed with the patient today.  The patient does not have concerns regarding medicines other than what has been noted above.  The following changes have been made:  See above.  Labs/ tests ordered today include:   No orders of the defined types were placed in this encounter.    Disposition:   FU here prn.    Patient is agreeable to this plan and will call if any problems develop in the interim.   SignedTruitt Merle, NP  04/08/2017 12:45 PM  [place] [address] [employer] [place], [place]  [address] Phone: [phone] Fax: [phone]

## 2017-04-08 NOTE — Patient Instructions (Signed)
We will be checking the following labs today - NONE   Medication Instructions:    Continue with your current medicines.     Testing/Procedures To Be Arranged:  N/A  Follow-Up:   See us back as needed.     Other Special Instructions:   N/A    If you need a refill on your cardiac medications before your next appointment, please call your pharmacy.   Call the [facility] office at [phone] if you have any questions, problems or concerns.      

## 2017-04-10 ENCOUNTER — Ambulatory Visit: Payer: BLUE CROSS/BLUE SHIELD | Admitting: Nurse Practitioner

## 2017-04-28 ENCOUNTER — Other Ambulatory Visit: Payer: Self-pay | Admitting: Internal Medicine

## 2017-04-29 ENCOUNTER — Encounter: Payer: Self-pay | Admitting: Internal Medicine

## 2017-04-29 ENCOUNTER — Other Ambulatory Visit: Payer: Self-pay

## 2017-04-29 MED ORDER — QUINAPRIL-HYDROCHLOROTHIAZIDE 20-12.5 MG PO TABS: 2.0000 | ORAL_TABLET | Freq: Every day | ORAL | 1 refills | Status: DC

## 2017-05-28 ENCOUNTER — Encounter: Payer: Self-pay | Admitting: Internal Medicine

## 2017-05-28 ENCOUNTER — Other Ambulatory Visit: Payer: Self-pay | Admitting: Internal Medicine

## 2017-05-28 MED ORDER — ALPRAZOLAM 0.5 MG PO TABS: 0.5000 mg | ORAL_TABLET | Freq: Every evening | ORAL | 5 refills | Status: AC | PRN

## 2017-05-28 NOTE — Telephone Encounter (Signed)
Control database checked last refill: 04/04/2016 LOV: 01/11/2017

## 2017-06-21 ENCOUNTER — Other Ambulatory Visit: Payer: Self-pay | Admitting: Internal Medicine

## 2017-07-02 ENCOUNTER — Encounter: Payer: Self-pay | Admitting: Internal Medicine

## 2017-07-05 ENCOUNTER — Ambulatory Visit (INDEPENDENT_AMBULATORY_CARE_PROVIDER_SITE_OTHER): Payer: BLUE CROSS/BLUE SHIELD | Admitting: Internal Medicine

## 2017-07-05 ENCOUNTER — Encounter: Payer: Self-pay | Admitting: Internal Medicine

## 2017-07-05 ENCOUNTER — Other Ambulatory Visit (INDEPENDENT_AMBULATORY_CARE_PROVIDER_SITE_OTHER): Payer: BLUE CROSS/BLUE SHIELD

## 2017-07-05 VITALS — BP 140/90 | HR 76 | Temp 98.4°F | Ht 67.0 in | Wt 178.0 lb

## 2017-07-05 DIAGNOSIS — R1011 Right upper quadrant pain: Secondary | ICD-10-CM

## 2017-07-05 DIAGNOSIS — G4733 Obstructive sleep apnea (adult) (pediatric): Secondary | ICD-10-CM

## 2017-07-05 DIAGNOSIS — Z9989 Dependence on other enabling machines and devices: Secondary | ICD-10-CM

## 2017-07-05 LAB — COMPREHENSIVE METABOLIC PANEL
ALBUMIN: 4.3 g/dL (ref 3.5–5.2)
ALK PHOS: 68 U/L (ref 39–117)
ALT: 17 U/L (ref 0–35)
AST: 12 U/L (ref 0–37)
BUN: 22 mg/dL (ref 6–23)
CALCIUM: 9.4 mg/dL (ref 8.4–10.5)
CHLORIDE: 101 meq/L (ref 96–112)
CO2: 30 mEq/L (ref 19–32)
Creatinine, Ser: 0.75 mg/dL (ref 0.40–1.20)
GFR: 83.43 mL/min (ref >60.00)
Glucose, Bld: 101 mg/dL — ABNORMAL HIGH (ref 70–99)
POTASSIUM: 3.6 meq/L (ref 3.5–5.1)
Sodium: 138 mEq/L (ref 135–145)
TOTAL PROTEIN: 7.6 g/dL (ref 6.0–8.3)
Total Bilirubin: 0.3 mg/dL (ref 0.2–1.2)

## 2017-07-05 LAB — CBC
HEMATOCRIT: 38.5 % (ref 36.0–46.0)
HEMOGLOBIN: 12.9 g/dL (ref 12.0–15.0)
MCHC: 33.6 g/dL (ref 30.0–36.0)
MCV: 87.8 fl (ref 78.0–100.0)
Platelets: 327 10*3/uL (ref 150.0–400.0)
RBC: 4.39 Mil/uL (ref 3.87–5.11)
RDW: 15.2 % (ref 11.5–15.5)
WBC: 6.5 10*3/uL (ref 4.0–10.5)

## 2017-07-05 LAB — LIPASE: Lipase: 33 U/L (ref 11.0–59.0)

## 2017-07-05 LAB — LIPID PANEL
CHOL/HDL RATIO: 6
CHOLESTEROL: 261 mg/dL — AB (ref 0–200)
HDL: 44.1 mg/dL (ref >39.00)
NonHDL: 217.14
TRIGLYCERIDES: 268 mg/dL — AB (ref 0.0–149.0)
VLDL: 53.6 mg/dL — AB (ref 0.0–40.0)

## 2017-07-05 LAB — LDL CHOLESTEROL, DIRECT: LDL DIRECT: 171 mg/dL

## 2017-07-05 NOTE — Patient Instructions (Signed)
We will check the ultrasound of the stomach and the labs today.   Come back for a physical sometime this summer.

## 2017-07-05 NOTE — Assessment & Plan Note (Signed)
Checking CBC, CMP, lipase, RUQ [place]. Depending on results may need further workup. Will use otc for pain if needed.

## 2017-07-05 NOTE — Progress Notes (Signed)
   Subjective:    Patient ID: Leslie Noble, female    DOB: 1955/05/08, 62 y.o.   MRN: [id number]  HPI The patient is a 62 YO female coming in for left sided abdomen pain. Started several months ago and is intermittent. She was hoping that it would go away but it has not. Stable since onset. Denies fevers or chills. Denies change in bowel habits. Last colonoscopy with several benign polyps in 2016 and not due for repeat until 2026. Recently had cardiac CT which was low risk for future events. CT angio stomach 2007 with normal renal blood flow and small hepatic cyst. She denies worsening with eating or drinking. No pain with bowel movements. Did have stomach tuck in 2017. Missed BP meds today. Some GERD for which she takes otc meds and not similar to these symptoms.   Review of Systems  Constitutional: Negative.   HENT: Negative.   Eyes: Negative.   Respiratory: Negative for cough, chest tightness and shortness of breath.   Cardiovascular: Negative for chest pain, palpitations and leg swelling.  Gastrointestinal: Positive for abdominal pain. Negative for abdominal distention, blood in stool, constipation, diarrhea, nausea and vomiting.  Musculoskeletal: Negative.   Skin: Negative.   Neurological: Negative.   Psychiatric/Behavioral: Negative.       Objective:   Physical Exam  Constitutional: She is oriented to person, place, and time. She appears well-developed and well-nourished.  HENT:  Head: Normocephalic and atraumatic.  Eyes: EOM are normal.  Neck: Normal range of motion.  Cardiovascular: Normal rate and regular rhythm.  Pulmonary/Chest: Effort normal and breath sounds normal. No respiratory distress. She has no wheezes. She has no rales.  Abdominal: Soft. Bowel sounds are normal. She exhibits no distension. There is tenderness in the right upper quadrant. There is no rebound, no guarding, no tenderness at McBurney's point and negative Murphy's sign.  Musculoskeletal: She exhibits no  edema.  Neurological: She is alert and oriented to person, place, and time. Coordination normal.  Skin: Skin is warm and dry.  Psychiatric: She has a normal mood and affect.   Vitals:   07/05/17 1549  BP: (!) 190/100  Pulse: 76  Temp: 98.4 F (36.9 C)  TempSrc: Oral  SpO2: 98%  Weight: 178 lb (80.7 kg)  Height: 5\' 7"  (1.702 m)      Assessment & Plan:

## 2017-07-05 NOTE — Assessment & Plan Note (Signed)
Sleep study in 2009 and wants to make sure pressures are still correct for her. Ordered CPAP titration.

## 2017-07-11 ENCOUNTER — Other Ambulatory Visit: Payer: Self-pay | Admitting: Internal Medicine

## 2017-07-11 DIAGNOSIS — G4733 Obstructive sleep apnea (adult) (pediatric): Secondary | ICD-10-CM

## 2017-07-11 DIAGNOSIS — Z9989 Dependence on other enabling machines and devices: Principal | ICD-10-CM

## 2017-07-15 ENCOUNTER — Encounter (HOSPITAL_COMMUNITY): Payer: Self-pay | Admitting: Emergency Medicine

## 2017-07-15 ENCOUNTER — Other Ambulatory Visit: Payer: Self-pay

## 2017-07-15 ENCOUNTER — Encounter: Payer: Self-pay | Admitting: Internal Medicine

## 2017-07-15 ENCOUNTER — Emergency Department (HOSPITAL_COMMUNITY)
Admission: EM | Admit: 2017-07-15 | Discharge: 2017-07-15 | Disposition: A | Discharge status: Home / Self Care | Payer: BLUE CROSS/BLUE SHIELD | Attending: Emergency Medicine | Admitting: Emergency Medicine

## 2017-07-15 DIAGNOSIS — I10 Essential (primary) hypertension: Secondary | ICD-10-CM

## 2017-07-15 DIAGNOSIS — Z87891 Personal history of nicotine dependence: Secondary | ICD-10-CM | POA: Diagnosis not present

## 2017-07-15 DIAGNOSIS — R040 Epistaxis: Secondary | ICD-10-CM | POA: Diagnosis not present

## 2017-07-15 DIAGNOSIS — Z7982 Long term (current) use of aspirin: Secondary | ICD-10-CM | POA: Insufficient documentation

## 2017-07-15 MED ORDER — OXYMETAZOLINE HCL 0.05 % NA SOLN
1.0000 | Freq: Once | NASAL | Status: AC
  Administered 2017-07-15: 1 via NASAL
  Filled 2017-07-15: qty 15

## 2017-07-15 MED ORDER — LIDOCAINE HCL 4 % EX SOLN
Freq: Once | CUTANEOUS | Status: AC
  Administered 2017-07-15: 1 mL via TOPICAL
  Filled 2017-07-15: qty 50

## 2017-07-15 NOTE — Discharge Instructions (Addendum)
Call ENT for follow-up in the next few days to have the packing removed.

## 2017-07-15 NOTE — ED Triage Notes (Signed)
Pt called EMS while at [facility] due to nose bleed with blood pouring out of nostril. Pt. States she had no trauma to nose, opened up the door at self storage unit and her nose started to bleed. Pt also has HTN with non compliance with medications. Pt. epitaxis began at 1830 today.

## 2017-07-15 NOTE — ED Notes (Signed)
Rhinorocket placed by MD; bleeding slowed but still dripping Patient ambulatory to bathroom with steady gait at this time

## 2017-07-15 NOTE — ED Notes (Signed)
Patient verbalizes understanding of discharge instructions. Opportunity for questioning and answers were provided. Armband removed by staff, pt discharged from ED ambulatory with husband.  

## 2017-07-15 NOTE — ED Provider Notes (Signed)
[facility] Provider Note   CSN: [id number] Arrival date & time: 07/15/17  7062     History   Chief Complaint Chief Complaint  Patient presents with  . Epistaxis    HPI [facility] Noble is a 62 y.o. female.  HPI Patient presents with right-sided epistaxis.  Began around 630.  States she was at a storage unit began to bleed.  Bleeding Noble both the front and the back of her nose.  No other bleeding.  No lightheadedness.  States she did have one feeling where she felt a little weird but thinks it was because she was anxious about the bleeding.  No trauma.  Has not had bleeding like this before.  Does not bruise easily. Past Medical History:  Diagnosis Date  . Alcohol abuse   . High cholesterol   . Hypertension   . Sleep apnea    CPAP    Patient Active Problem List   Diagnosis Date Noted  . RUQ pain 07/05/2017  . OSA on CPAP 07/05/2017  . Benign thyroid cyst 01/11/2017  . Low back pain 01/11/2017  . Carotid atherosclerosis 01/11/2017  . Syncope 04/04/2016  . Lipoma of neck 02/17/2016  . Atypical chest pain 02/17/2016  . Essential hypertension 01/20/2016  . Alcohol use 01/20/2016  . Heme positive stool   . GERD (gastroesophageal reflux disease) 12/10/2014    Past Surgical History:  Procedure Laterality Date  . COLONOSCOPY WITH PROPOFOL N/A 12/31/2014   Procedure: COLONOSCOPY WITH PROPOFOL;  Surgeon: Mauri Pole, MD;  Location: [facility];  Service: Endoscopy;  Laterality: N/A;  . ESOPHAGOGASTRODUODENOSCOPY (EGD) WITH PROPOFOL N/A 12/31/2014   Procedure: ESOPHAGOGASTRODUODENOSCOPY (EGD) WITH PROPOFOL;  Surgeon: Mauri Pole, MD;  Location: [facility];  Service: Endoscopy;  Laterality: N/A;  . KNEE ARTHROSCOPY Left   . LIPOMA EXCISION Left 12/31/2016   neck     OB History   None      Home Medications    Prior to Admission medications   Medication Sig Start Date End Date Taking? Authorizing Provider    amLODipine (NORVASC) 10 MG tablet TAKE 1 TABLET (10 MG TOTAL) BY MOUTH DAILY. NEED ANNUAL VISIT FOR FURTHER REFILLS 06/21/17  Yes Hoyt Koch, MD  aspirin EC 81 MG tablet Take 81 mg by mouth daily.   Yes [provider]  CALCIUM CARBONATE-VIT D-MIN PO Take 600 mg by mouth 2 (two) times daily.    Yes [provider]  quinapril-hydrochlorothiazide (ACCURETIC) 20-12.5 MG tablet Take 2 tablets by mouth daily. 04/29/17  Yes Hoyt Koch, MD  ALPRAZolam Duanne Moron) 0.5 MG tablet Take 1 tablet (0.5 mg total) by mouth at bedtime as needed for anxiety. Patient not taking: Reported on 07/15/2017 05/28/17   Hoyt Koch, MD    Family History Family History  Problem Relation Age of Onset  . Hypertension Father   . Barrett's esophagus Father   . GER disease Father   . Sleep apnea Father   . Breast cancer Mother   . Heart disease Mother   . Drug abuse Brother        drug overdose-deceased  . Cancer Paternal Grandmother        cancer of the body wall  . Alcoholism Maternal Grandfather     Social History Social History   Tobacco Use  . Smoking status: Former Smoker    Types: Cigarettes    Last attempt to quit: 02/12/1982    Years since quitting: 35.4  .  Smokeless tobacco: Never Used  Substance Use Topics  . Alcohol use: Yes    Alcohol/week: 0.0 oz    Comment: wine- 2-3 per day  . Drug use: No     Allergies   Patient has no known allergies.   Review of Systems Review of Systems  Constitutional: Negative for appetite change.  HENT: Positive for nosebleeds.   Respiratory: Negative for shortness of breath.   Cardiovascular: Negative for chest pain.  Gastrointestinal: Negative for abdominal pain.  Genitourinary: Negative for flank pain.  Musculoskeletal: Negative for back pain.  Skin: Negative for rash.  Neurological: Negative for light-headedness.  Hematological: Does not bruise/bleed easily.  Psychiatric/Behavioral: Negative for confusion.      Physical Exam Updated Vital Signs BP (!) 184/101   Pulse 99   Resp 16   Ht 5\' 7"  (1.702 m)   Wt 80.7 kg (178 lb)   SpO2 97%   BMI 27.88 kg/m   Physical Exam  Constitutional: She appears well-developed.  HENT:  Head: Normocephalic.  Patient holding pressure on her nose.  No active bleeding seen.  Or site of previous bleeding.  Afrin and lidocaine soaked cotton placed and no evidence of bleeding.  Eyes: Pupils are equal, round, and reactive to light.  Neck: Neck supple.  Cardiovascular: Normal rate.  Pulmonary/Chest: She has no rales.  Abdominal: There is no tenderness.  Musculoskeletal: She exhibits no tenderness.  Neurological: She is alert.  Skin: Skin is warm. Capillary refill takes less than 2 seconds.     ED Treatments / Results  Labs (all labs ordered are listed, but only abnormal results are displayed) Labs Reviewed - No data to display  EKG None  Radiology No results found.  Procedures .Epistaxis Management Date/Time: 07/15/2017 10:19 PM Performed by: Davonna Belling, MD Authorized by: Davonna Belling, MD   Consent:    Consent obtained:  Verbal   Consent given by:  Patient   Risks discussed:  Bleeding, infection, nasal injury and pain   Alternatives discussed:  Delayed treatment and no treatment Anesthesia (see MAR for exact dosages):    Anesthesia method:  Topical application   Topical anesthetic:  Lidocaine gel Procedure details:    Treatment site:  R anterior   Treatment method:  Nasal balloon   Treatment episode: recurring   Post-procedure details:    Assessment:  Bleeding stopped   Patient tolerance of procedure:  Tolerated well, no immediate complications   (including critical care time)  Medications Ordered in ED Medications  oxymetazoline (AFRIN) 0.05 % nasal spray 1 spray (1 spray Each Nare Given 07/15/17 2004)  lidocaine (XYLOCAINE) 4 % external solution (1 mL Topical Given 07/15/17 2004)     Initial Impression / Assessment  and Plan / ED Course  I have reviewed the triage vital signs and the nursing notes.  Pertinent labs & imaging results that were available during my care of the patient were reviewed by me and considered in my medical decision making (see chart for details).     Patient with epistaxis.  Initial control by patient.  Patient however had been discharged and had just made it to the parking lot where she began to breathe somewhat actively again.  5.5 cm nasal balloon placed. Bleeding improved and patient tolerated.  Will discharge to follow-up with ENT.  Final Clinical Impressions(s) / ED Diagnoses   Final diagnoses:  Epistaxis  Hypertension, unspecified type    ED Discharge Orders    None       Sai Zinn,  Ovid Curd, MD 07/15/17 2219

## 2017-07-15 NOTE — ED Notes (Signed)
ED Provider at bedside. 

## 2017-07-15 NOTE — ED Notes (Signed)
Patient returned to ED after being discharged 5 minutes ago d/t epistaxis restarting in the parking lot. MD notified and will see patient again.

## 2017-07-16 ENCOUNTER — Telehealth: Payer: Self-pay | Admitting: *Deleted

## 2017-07-16 ENCOUNTER — Other Ambulatory Visit: Payer: BLUE CROSS/BLUE SHIELD

## 2017-07-16 ENCOUNTER — Encounter: Payer: Self-pay | Admitting: Internal Medicine
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
## 2017-07-19 ENCOUNTER — Ambulatory Visit
Admission: RE | Admit: 2017-07-19 | Discharge: 2017-07-19 | Disposition: A | Discharge status: Home / Self Care | Payer: BLUE CROSS/BLUE SHIELD | Source: Ambulatory Visit | Attending: Internal Medicine | Admitting: Internal Medicine

## 2017-07-19 DIAGNOSIS — R1011 Right upper quadrant pain: Secondary | ICD-10-CM

## 2017-07-26 ENCOUNTER — Ambulatory Visit (INDEPENDENT_AMBULATORY_CARE_PROVIDER_SITE_OTHER): Payer: BLUE CROSS/BLUE SHIELD | Admitting: Internal Medicine

## 2017-07-26 ENCOUNTER — Encounter: Payer: Self-pay | Admitting: Internal Medicine

## 2017-07-26 VITALS — BP 138/82 | HR 78 | Temp 98.4°F | Ht 67.0 in | Wt 175.0 lb

## 2017-07-26 DIAGNOSIS — I10 Essential (primary) hypertension: Secondary | ICD-10-CM

## 2017-07-26 DIAGNOSIS — R040 Epistaxis: Secondary | ICD-10-CM

## 2017-07-26 NOTE — Progress Notes (Signed)
   Subjective:    Patient ID: Leslie Noble, female    DOB: 1956-01-11, 62 y.o.   MRN: [id number]  HPI The patient is a 62 YO female coming in for ER follow up (in for nosebleed with packing and ENT follow up). Did have high blood pressure during the nosebleed. No further bleeding since removal of packing with ENT. Was told by ENT that her BP was still elevated when removing the packing and she should come see [place] to change medications. Was taking aspirin 81 mg daily which she has stopped since the nosebleed.  She is concerned about BP as an EMT told her next time "it could be in her brain" a bleed like this. She is very worried about BP problems and has been checking at home. She has had BP around 120/80s after finishing exercise this week. Did miss a few doses of medication in the week prior to nosebleed. She is now taking everyday and at the same time of day.   Review of Systems  Constitutional: Negative.   HENT: Negative.   Eyes: Negative.   Respiratory: Negative for cough, chest tightness and shortness of breath.   Cardiovascular: Negative for chest pain, palpitations and leg swelling.  Gastrointestinal: Negative for abdominal distention, abdominal pain, constipation, diarrhea, nausea and vomiting.  Musculoskeletal: Negative.   Skin: Negative.   Neurological: Negative.   Psychiatric/Behavioral: Negative.       Objective:   Physical Exam  Constitutional: She is oriented to person, place, and time. She appears well-developed and well-nourished.  HENT:  Head: Normocephalic and atraumatic.  Eyes: EOM are normal.  Neck: Normal range of motion.  Cardiovascular: Normal rate and regular rhythm.  Pulmonary/Chest: Effort normal and breath sounds normal. No respiratory distress. She has no wheezes. She has no rales.  Abdominal: Soft. Bowel sounds are normal. She exhibits no distension. There is no tenderness. There is no rebound.  Musculoskeletal: She exhibits no edema.  Neurological: She is  alert and oriented to person, place, and time. Coordination normal.  Skin: Skin is warm and dry.  Psychiatric: She has a normal mood and affect.   Vitals:   07/26/17 1404  BP: 138/82  Pulse: 78  Temp: 98.4 F (36.9 C)  TempSrc: Oral  SpO2: 98%  Weight: 175 lb (79.4 kg)  Height: 5\' 7"  (1.702 m)      Assessment & Plan:

## 2017-07-26 NOTE — Patient Instructions (Signed)
Another thing that you can use if needed is affrin to stop bleeding.

## 2017-07-27 DIAGNOSIS — R040 Epistaxis: Secondary | ICD-10-CM | POA: Insufficient documentation

## 2017-07-27 NOTE — Assessment & Plan Note (Addendum)
BP control seems adequate. She is encouraged to continue monitoring 2-3 times weekly at home. Continue amlodipine 10 mg daily and quinapril/hctz 40/25 mg daily.

## 2017-07-27 NOTE — Assessment & Plan Note (Signed)
No bleeding on exam today, had silver nitrate at ENT after removal of packing. No trigger to episode. Was taking aspirin 81 mg and advised that cardiac risk is not high enough to need this dosing. She will stop aspirin 81 mg indefinitely.

## 2017-09-19 ENCOUNTER — Other Ambulatory Visit: Payer: Self-pay | Admitting: Internal Medicine

## 2017-10-23 ENCOUNTER — Other Ambulatory Visit: Payer: Self-pay | Admitting: Internal Medicine

## 2017-12-18 ENCOUNTER — Other Ambulatory Visit: Payer: Self-pay | Admitting: Internal Medicine

## 2019-05-03 IMAGING — US US ABDOMEN LIMITED
1 series · 14 of 25 positions shown · non-contrast
Comparison: 08/10/2005 CT abdomen

CLINICAL DATA: Right upper quadrant abdominal pain for 2 months

EXAM:
ULTRASOUND ABDOMEN LIMITED RIGHT UPPER QUADRANT

[Series 1: us abdomen limited · 0.23mm/px · 14 of 36 slices shown]
[im 1/36]
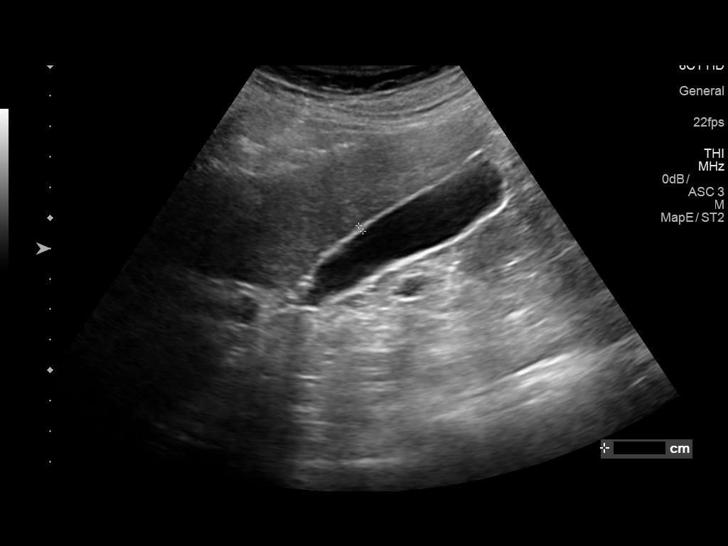
[im 3/36]
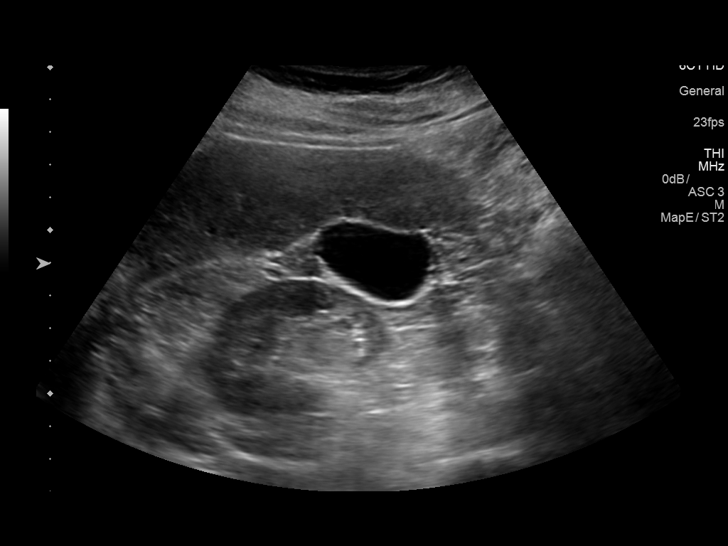
[im 6/36]
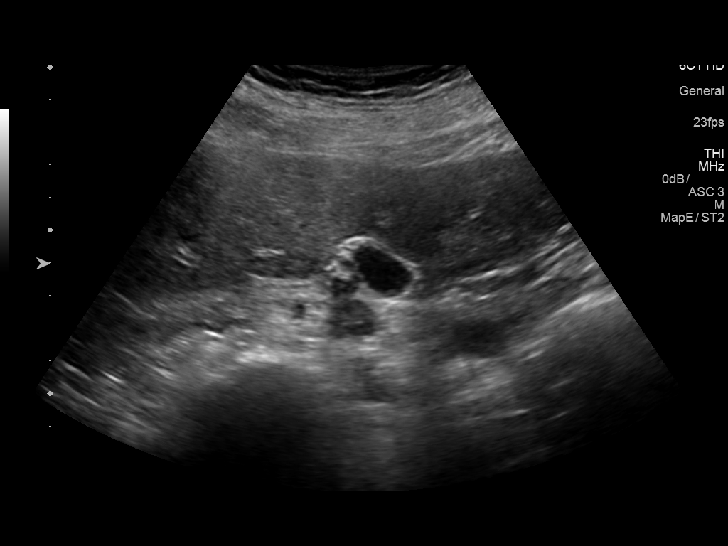
[im 9/36]
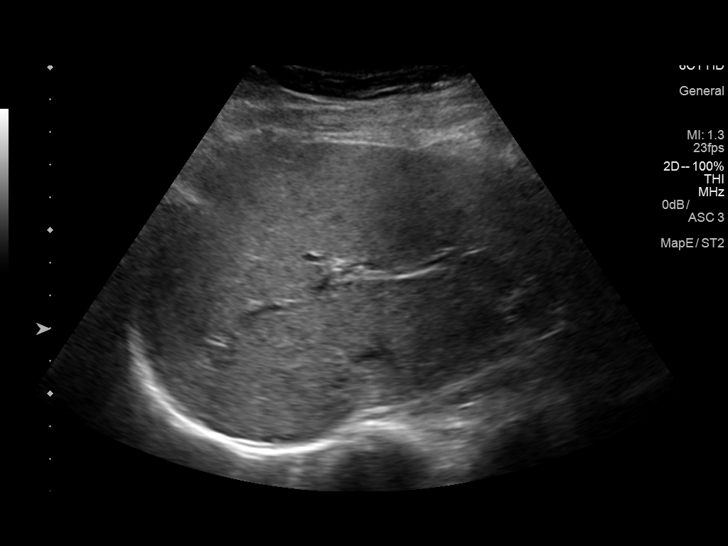
[im 12/36]
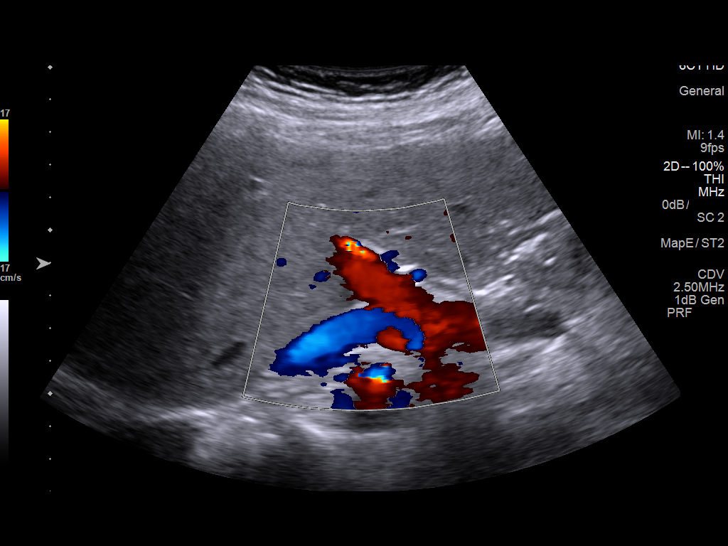
[im 14/36]
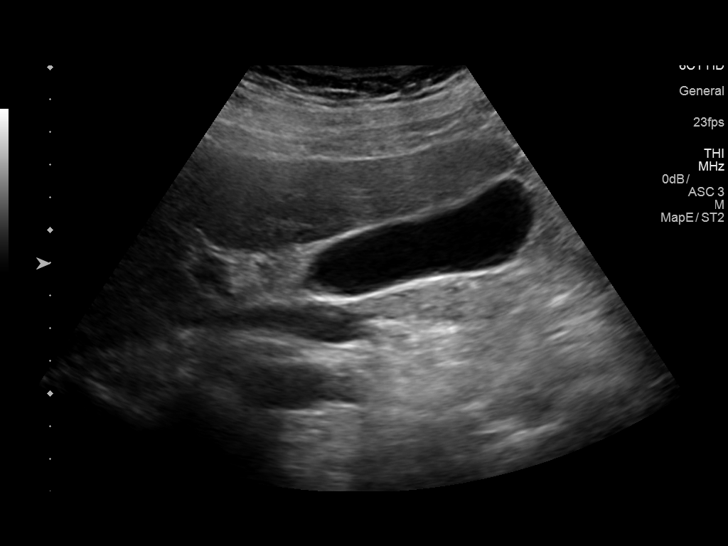
[im 17/36]
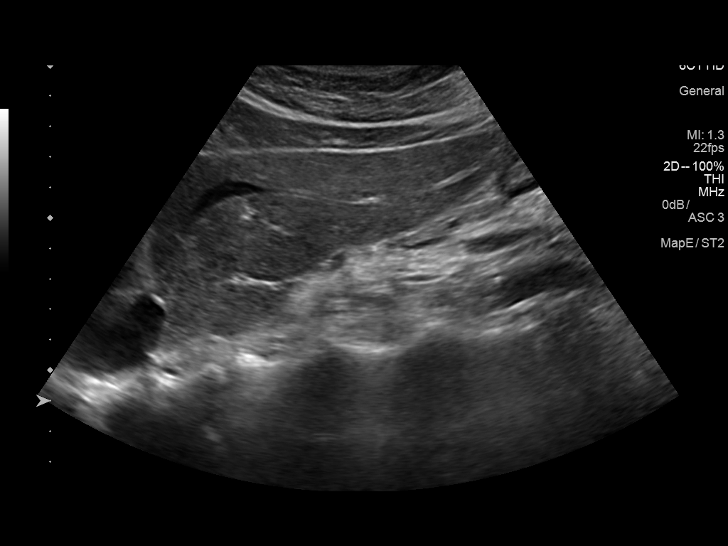
[im 19/36]
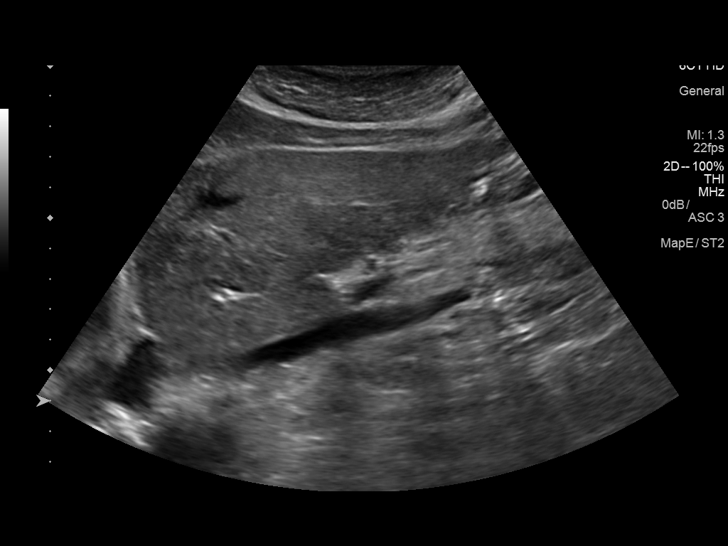
[im 22/36]
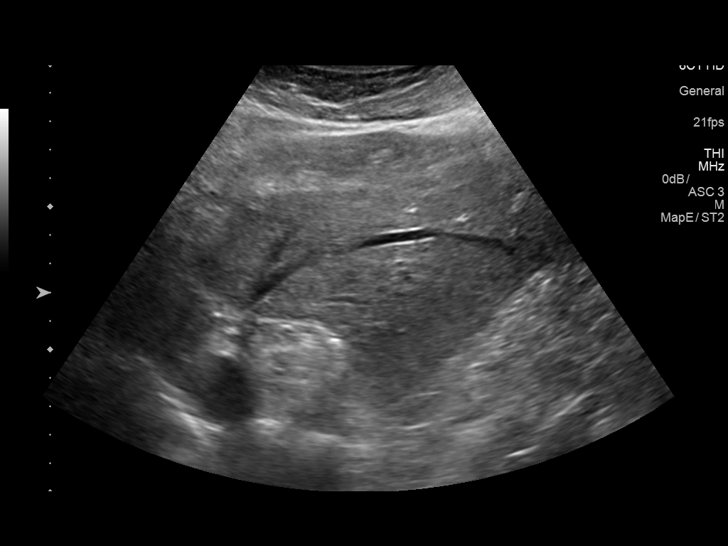
[im 24/36]
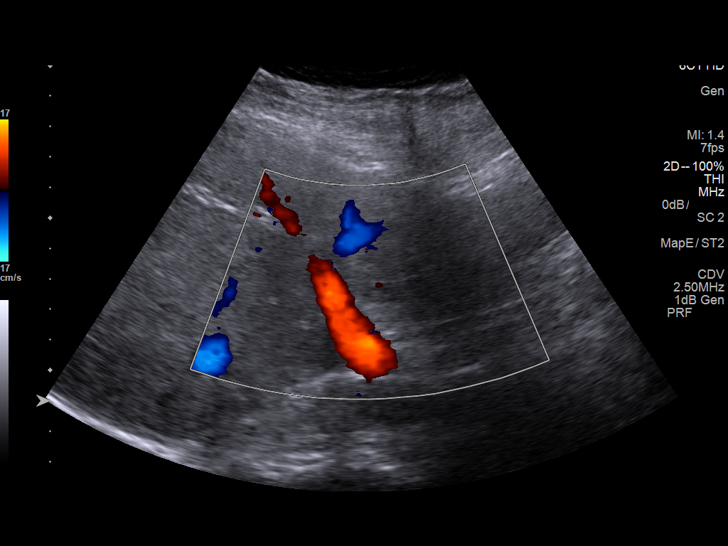
[im 27/36]
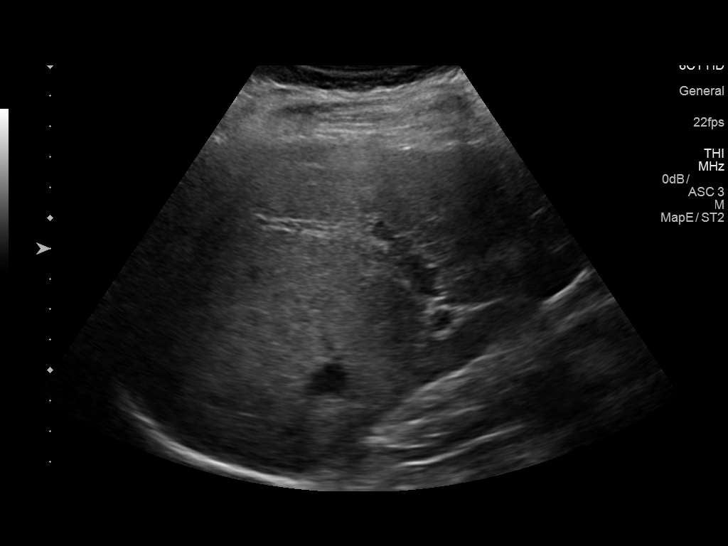
[im 30/36]
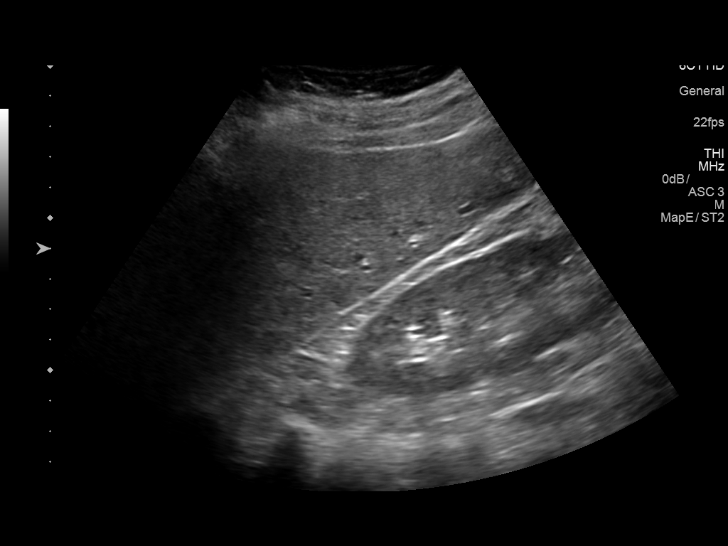
[im 33/36]
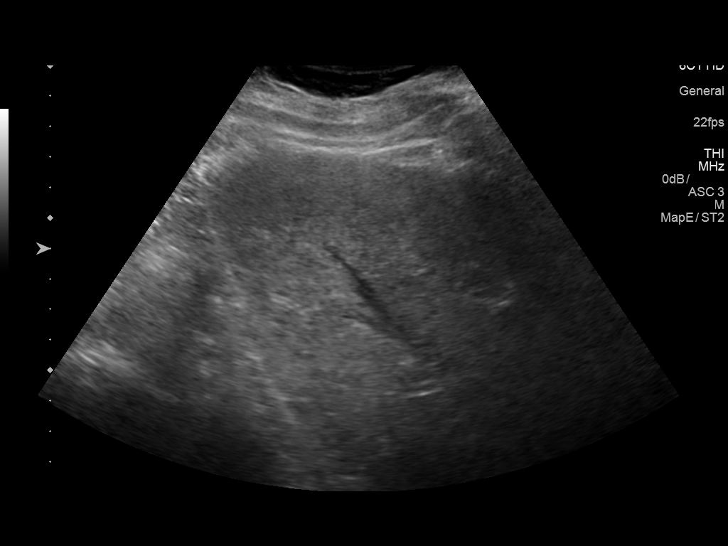
[im 36/36]
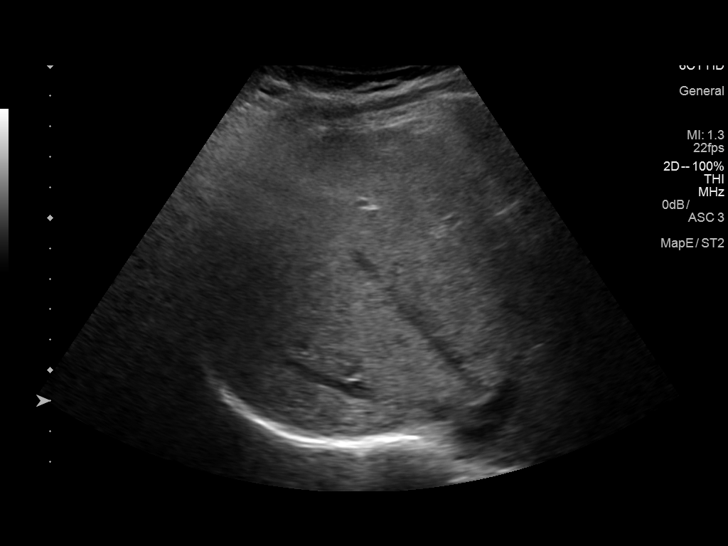

[14 of 25 positions shown; findings below may reference images not displayed]

FINDINGS: Gallbladder:

No gallstones or wall thickening visualized. No sonographic Murphy
sign noted by sonographer.

Common bile duct:

Diameter: 2 mm

Liver:

Liver parenchymal echogenicity is top-normal. Normal liver
parenchymal echotexture. No liver masses. Portal vein is patent on
color Doppler imaging with normal direction of blood flow towards
the liver.
IMPRESSION: Normal right upper quadrant abdominal sonogram, with no
cholelithiasis.
